# Patient Record
Sex: Female | Born: 1970
Health system: Southern US, Community
[De-identification: ages and names within clinical notes are randomized; demographics above are authoritative.]

## PROBLEM LIST (undated history)

## (undated) DIAGNOSIS — Z803 Family history of malignant neoplasm of breast: Secondary | ICD-10-CM

## (undated) DIAGNOSIS — T7840XA Allergy, unspecified, initial encounter: Secondary | ICD-10-CM

## (undated) DIAGNOSIS — F419 Anxiety disorder, unspecified: Secondary | ICD-10-CM

## (undated) DIAGNOSIS — E7800 Pure hypercholesterolemia, unspecified: Secondary | ICD-10-CM

## (undated) DIAGNOSIS — F32A Depression, unspecified: Secondary | ICD-10-CM

## (undated) HISTORY — PX: AUGMENTATION MAMMAPLASTY: SUR837

## (undated) HISTORY — DX: Anxiety disorder, unspecified: F41.9

## (undated) HISTORY — DX: Depression, unspecified: F32.A

## (undated) HISTORY — PX: JOINT REPLACEMENT: SHX530

## (undated) HISTORY — DX: Allergy, unspecified, initial encounter: T78.40XA

## (undated) HISTORY — PX: EYE SURGERY: SHX253

## (undated) HISTORY — DX: Family history of malignant neoplasm of breast: Z80.3

## (undated) HISTORY — PX: SHOULDER ARTHROSCOPY: SHX128

---

## 2013-05-17 ENCOUNTER — Ambulatory Visit: Payer: Self-pay | Admitting: Family Medicine

## 2013-11-05 DIAGNOSIS — G4700 Insomnia, unspecified: Secondary | ICD-10-CM | POA: Insufficient documentation

## 2014-06-19 ENCOUNTER — Ambulatory Visit: Payer: Self-pay | Admitting: Family Medicine

## 2015-02-12 ENCOUNTER — Other Ambulatory Visit: Payer: Self-pay | Admitting: Family Medicine

## 2015-03-19 ENCOUNTER — Emergency Department
Admission: EM | Admit: 2015-03-19 | Discharge: 2015-03-19 | Disposition: A | Discharge status: Home / Self Care | Payer: Worker's Compensation | Attending: Emergency Medicine | Admitting: Emergency Medicine

## 2015-03-19 ENCOUNTER — Encounter: Payer: Self-pay | Admitting: Medical Oncology

## 2015-03-19 DIAGNOSIS — W010XXA Fall on same level from slipping, tripping and stumbling without subsequent striking against object, initial encounter: Secondary | ICD-10-CM | POA: Diagnosis not present

## 2015-03-19 DIAGNOSIS — Y9389 Activity, other specified: Secondary | ICD-10-CM | POA: Insufficient documentation

## 2015-03-19 DIAGNOSIS — Y9289 Other specified places as the place of occurrence of the external cause: Secondary | ICD-10-CM | POA: Diagnosis not present

## 2015-03-19 DIAGNOSIS — Z880 Allergy status to penicillin: Secondary | ICD-10-CM | POA: Insufficient documentation

## 2015-03-19 DIAGNOSIS — Y990 Civilian activity done for income or pay: Secondary | ICD-10-CM | POA: Diagnosis not present

## 2015-03-19 DIAGNOSIS — S3992XA Unspecified injury of lower back, initial encounter: Secondary | ICD-10-CM | POA: Insufficient documentation

## 2015-03-19 DIAGNOSIS — M533 Sacrococcygeal disorders, not elsewhere classified: Secondary | ICD-10-CM

## 2015-03-19 HISTORY — DX: Pure hypercholesterolemia, unspecified: E78.00

## 2015-03-19 MED ORDER — CYCLOBENZAPRINE HCL 10 MG PO TABS: 10.0000 mg | ORAL_TABLET | Freq: Three times a day (TID) | ORAL | Status: DC | PRN

## 2015-03-19 MED ORDER — IBUPROFEN 800 MG PO TABS: 800.0000 mg | ORAL_TABLET | Freq: Three times a day (TID) | ORAL | Status: DC | PRN

## 2015-03-19 MED ORDER — NAPROXEN 500 MG PO TABS: 500.0000 mg | ORAL_TABLET | Freq: Two times a day (BID) | ORAL | Status: DC

## 2015-03-19 NOTE — ED Notes (Signed)
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

## 2015-03-19 NOTE — ED Notes (Signed)
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 Pt denies hitting head/LOC. Workers Tax adviser.

## 2015-03-19 NOTE — ED Provider Notes (Signed)
[facility] Emergency Department Provider Note  ____________________________________________  Time seen: Approximately 1:21 PM  I have reviewed the triage vital signs and the nursing notes.   HISTORY  Chief Complaint Fall and Back Pain    HPI Jenna Holt is a 44 y.o. female complaining of left back pain secondary to a slip and fall at work today. 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 Patient denies any head injury or loss of consciousness. Patient applied ice to area with some moderate relief.  Past Medical History  Diagnosis Date  . High cholesterol     There are no active problems to display for this patient.   Past Surgical History  Procedure Laterality Date  . Joint replacement      No current outpatient prescriptions on file.  Allergies Penicillins  No family history on file.  Social History Social History  Substance Use Topics  . Smoking status: Never Smoker   . Smokeless tobacco: None  . Alcohol Use: Yes     Comment: occ    Review of Systems Constitutional: No fever/chills Eyes: No visual changes. ENT: No sore throat. Cardiovascular: Denies chest pain. Respiratory: Denies shortness of breath. Gastrointestinal: No abdominal pain.  No nausea, no vomiting.  No diarrhea.  No constipation. Genitourinary: Negative for dysuria. Musculoskeletal: Positive for back pain Skin: Negative for rash. Neurological: Negative for headaches, focal weakness or numbness. Endocrine:Hyperlipidemia Hematological/Lymphatic: Allergic/Immunilogical: Insulin 10-point ROS otherwise negative.  ____________________________________________   PHYSICAL EXAM:  VITAL SIGNS: ED Triage Vitals  Enc Vitals Group     BP 03/19/15 1239 132/88 mmHg     Pulse Rate 03/19/15 1239 78     Resp 03/19/15 1239 18     Temp 03/19/15 1239 98.2 F (36.8 C)     Temp Source 03/19/15 1239 Oral     SpO2  03/19/15 1239 97 %     Weight 03/19/15 1239 160 lb (72.576 kg)     Height 03/19/15 1239 5\' 9"  (1.753 m)     Head Cir --      Peak Flow --      Pain Score 03/19/15 1240 6     Pain Loc --      Pain Edu? --      Excl. in [place]? --     Constitutional: Alert and oriented. Well appearing and in no acute distress. Eyes: Conjunctivae are normal. PERRL. EOMI. Head: Atraumatic. Nose: No congestion/rhinnorhea. Mouth/Throat: Mucous membranes are moist.  Oropharynx non-erythematous. Neck: No stridor.  No cervical spine tenderness to palpation. Hematological/Lymphatic/Immunilogical: No cervical lymphadenopathy. Cardiovascular: Normal rate, regular rhythm. Grossly normal heart sounds.  Good peripheral circulation. Respiratory: Normal respiratory effort.  No retractions. Lungs CTAB. Gastrointestinal: Soft and nontender. No distention. No abdominal bruits. No CVA tenderness. Musculoskeletal: No lower extremity tenderness nor edema.  No joint effusions. Neurologic:  Normal speech and language. No gross focal neurologic deficits are appreciated. No gait instability. Skin:  Skin is warm, dry and intact. No rash noted. Psychiatric: Mood and affect are normal. Speech and behavior are normal.  ____________________________________________   LABS (all labs ordered are listed, but only abnormal results are displayed)  Labs Reviewed - No data to display ____________________________________________  EKG   ____________________________________________  RADIOLOGY   ____________________________________________   PROCEDURES  Procedure(s) performed: None  Critical Care performed: No  ____________________________________________   INITIAL IMPRESSION / ASSESSMENT AND PLAN / ED COURSE  Pertinent labs & imaging results  that were available during my care of the patient were reviewed by me and considered in my medical decision making (see chart for details).  Left low back contusion. Patient advised  on supportive care for back pain. She prescription for naproxen and Flexeril take as directed. Patient advised to ER if condition worsens. ____________________________________________   FINAL CLINICAL IMPRESSION(S) / ED DIAGNOSES  Final diagnoses:  Back pain, sacroiliac      Sable Feil, PA-C 03/19/15 1406  Lisa Roca, MD 03/19/15 [phone]

## 2015-05-12 ENCOUNTER — Other Ambulatory Visit: Payer: Self-pay | Admitting: Family Medicine

## 2015-05-12 DIAGNOSIS — Z1231 Encounter for screening mammogram for malignant neoplasm of breast: Secondary | ICD-10-CM

## 2015-06-24 ENCOUNTER — Ambulatory Visit
Admission: RE | Admit: 2015-06-24 | Discharge: 2015-06-24 | Disposition: A | Discharge status: Home / Self Care | Payer: No Typology Code available for payment source | Source: Ambulatory Visit | Attending: Family Medicine | Admitting: Family Medicine

## 2015-06-24 ENCOUNTER — Ambulatory Visit: Payer: Self-pay

## 2015-06-24 ENCOUNTER — Other Ambulatory Visit: Payer: Self-pay | Admitting: Family Medicine

## 2015-06-24 DIAGNOSIS — Z1231 Encounter for screening mammogram for malignant neoplasm of breast: Secondary | ICD-10-CM | POA: Insufficient documentation

## 2015-06-24 DIAGNOSIS — Z9882 Breast implant status: Secondary | ICD-10-CM | POA: Diagnosis not present

## 2016-05-27 ENCOUNTER — Other Ambulatory Visit: Payer: Self-pay | Admitting: Family Medicine

## 2016-06-08 ENCOUNTER — Other Ambulatory Visit: Payer: Self-pay | Admitting: Family Medicine

## 2016-06-08 DIAGNOSIS — Z1231 Encounter for screening mammogram for malignant neoplasm of breast: Secondary | ICD-10-CM

## 2016-07-13 ENCOUNTER — Ambulatory Visit
Admission: RE | Admit: 2016-07-13 | Discharge: 2016-07-13 | Disposition: A | Discharge status: Home / Self Care | Payer: No Typology Code available for payment source | Source: Ambulatory Visit | Attending: Family Medicine | Admitting: Family Medicine

## 2016-07-13 DIAGNOSIS — Z1231 Encounter for screening mammogram for malignant neoplasm of breast: Secondary | ICD-10-CM | POA: Diagnosis not present

## 2017-06-29 ENCOUNTER — Other Ambulatory Visit: Payer: Self-pay | Admitting: Family Medicine

## 2017-06-29 DIAGNOSIS — Z1231 Encounter for screening mammogram for malignant neoplasm of breast: Secondary | ICD-10-CM

## 2017-08-04 ENCOUNTER — Ambulatory Visit
Admission: RE | Admit: 2017-08-04 | Discharge: 2017-08-04 | Disposition: A | Discharge status: Home / Self Care | Payer: Commercial Managed Care - PPO | Source: Ambulatory Visit | Attending: Family Medicine | Admitting: Family Medicine

## 2017-08-04 DIAGNOSIS — Z1231 Encounter for screening mammogram for malignant neoplasm of breast: Secondary | ICD-10-CM | POA: Diagnosis not present

## 2017-08-23 DIAGNOSIS — G8929 Other chronic pain: Secondary | ICD-10-CM | POA: Insufficient documentation

## 2017-08-23 DIAGNOSIS — M25552 Pain in left hip: Secondary | ICD-10-CM | POA: Insufficient documentation

## 2018-07-04 ENCOUNTER — Other Ambulatory Visit: Payer: Self-pay | Admitting: Family Medicine

## 2018-07-04 DIAGNOSIS — Z1231 Encounter for screening mammogram for malignant neoplasm of breast: Secondary | ICD-10-CM

## 2018-08-07 ENCOUNTER — Ambulatory Visit
Admission: RE | Admit: 2018-08-07 | Discharge: 2018-08-07 | Disposition: A | Discharge status: Home / Self Care | Payer: BLUE CROSS/BLUE SHIELD | Source: Ambulatory Visit | Attending: Family Medicine | Admitting: Family Medicine

## 2018-08-07 ENCOUNTER — Encounter: Payer: Self-pay | Admitting: Radiology

## 2018-08-07 DIAGNOSIS — Z1231 Encounter for screening mammogram for malignant neoplasm of breast: Secondary | ICD-10-CM | POA: Diagnosis present

## 2019-01-03 DIAGNOSIS — K5901 Slow transit constipation: Secondary | ICD-10-CM | POA: Insufficient documentation

## 2019-01-03 DIAGNOSIS — Z1211 Encounter for screening for malignant neoplasm of colon: Secondary | ICD-10-CM | POA: Insufficient documentation

## 2019-04-12 ENCOUNTER — Encounter: Payer: Self-pay | Admitting: Internal Medicine

## 2019-04-12 ENCOUNTER — Ambulatory Visit: Payer: 59 | Admitting: Internal Medicine

## 2019-04-12 ENCOUNTER — Other Ambulatory Visit: Payer: Self-pay

## 2019-04-12 VITALS — BP 138/93 | HR 78 | Temp 99.5°F | Resp 14 | Ht 69.0 in | Wt 177.0 lb

## 2019-04-12 DIAGNOSIS — R030 Elevated blood-pressure reading, without diagnosis of hypertension: Secondary | ICD-10-CM | POA: Insufficient documentation

## 2019-04-12 DIAGNOSIS — M25552 Pain in left hip: Secondary | ICD-10-CM

## 2019-04-12 NOTE — Progress Notes (Signed)
S - presents with left hip pain  Present for over 2 years, and has tried many measures including PT, dry needling, other modalities, and has increased more recent past Very much limiting her activities at present Painful at rest and increased with movements Occas feels it into her buttocks, and to the lateral side of her leg, but feels it mostly in the hip joint proper regio No fevers other infectious symptoms concerning for Covid Has tried NSAIDs, 800 mg ibuprofen and not getting relief Notes the pain makes her irritable and on edge No tobacco history Not able to run anymore, struggles to be active  No cancer history No diabetes history No HTN hx   Current Outpatient Medications on File Prior to Visit  Medication Sig Dispense Refill  . cyclobenzaprine (FLEXERIL) 10 MG tablet Take 1 tablet (10 mg total) by mouth every 8 (eight) hours as needed for muscle spasms. 15 tablet 0  . ibuprofen (ADVIL,MOTRIN) 800 MG tablet Take 1 tablet (800 mg total) by mouth every 8 (eight) hours as needed for moderate pain. 15 tablet 0  . rosuvastatin (CRESTOR) 20 MG tablet     . traMADol (ULTRAM) 50 MG tablet      No current facility-administered medications on file prior to visit.      Allergies  Allergen Reactions  . Latex Dermatitis    If worn  . Penicillins Hives  . Naproxen Hives, Itching and Rash     Never smoker  O - NAD, masked  BP (!) 138/93 (BP Location: Right Arm, Patient Position: Sitting, Cuff Size: Large)   Pulse 78   Temp 99.5 F (37.5 C) (Oral)   Resp 14   Ht 5\' 9"  (1.753 m)   Wt 177 lb (80.3 kg)   SpO2 96%   BMI 26.14 kg/m    HEENT - sclera anicteric Abdomen - NT with palpation diffusely, ND Hip - ROM:   + pain with hip flexion when supine (knee to chest)   + pain with hip abduction and adduction greater   Min pain with internal/external rotation  Tender with palpation at the hip joint proper  NT at trochanteric bursa region with palpation today  Modified FABER test  +  SLR neg and mild pain with SLR  NT CVA region and lower back  Sensation intact to LT in distal LE Affect not flat, approp with conversation  Ass - Left hip pain - acute on chronic, has tried the conservative approach in past and does believe she needs to see ortho again (as has in the past and one point did discuss total hip but not pursue at that time, did have injection in the past)  Plan -  Agreed with above and ortho referral initiated X-ray discussed - will get at ortho visit  Relative rest from exertional activities as doing at present  ice recommended liberally on a daily basis   Can use ibuprofen products or aleve product (or generic naproxen) up to two tabs twice daily prn with food prn She asked about natural remedies that may improve the pain and not a lot of evidence to support use of one, noted chondroitin products have some evidence for arthritis pain Noted anti-anxiety meds/anti-depressants that sometimes are added to help and she was not anxious to try these presently. She noted if things are more problematic in the short term, she will f/u again to entertain meds for this Await ortho input  2. BP increased today - likely up some with the  pain increase and will monitor at present. She denies any history of higher BP in her past.

## 2019-04-19 DIAGNOSIS — M1612 Unilateral primary osteoarthritis, left hip: Secondary | ICD-10-CM | POA: Diagnosis not present

## 2019-05-08 ENCOUNTER — Other Ambulatory Visit: Payer: 59

## 2019-05-08 ENCOUNTER — Ambulatory Visit: Payer: 59

## 2019-05-08 ENCOUNTER — Other Ambulatory Visit: Payer: Self-pay

## 2019-05-08 DIAGNOSIS — Z01818 Encounter for other preprocedural examination: Secondary | ICD-10-CM

## 2019-05-08 DIAGNOSIS — Z23 Encounter for immunization: Secondary | ICD-10-CM

## 2019-05-08 LAB — POCT URINALYSIS DIPSTICK
Bilirubin, UA: NEGATIVE
Blood, UA: NEGATIVE
Glucose, UA: NEGATIVE
Ketones, UA: NEGATIVE
Leukocytes, UA: NEGATIVE
Nitrite, UA: NEGATIVE
Protein, UA: NEGATIVE
Spec Grav, UA: 1.02 (ref 1.010–1.025)
Urobilinogen, UA: 0.2 E.U./dL
pH, UA: 6 (ref 5.0–8.0)

## 2019-05-08 NOTE — Progress Notes (Signed)
Labs & EKG collected for [facility] Pre-operative Clearance Form. Surgery tentatively scheduled for 06/22/2019.  AMD

## 2019-05-09 LAB — CMP12+LP+TP+TSH+6AC+CBC/D/PLT
ALT: 17 IU/L (ref 0–32)
AST: 32 IU/L (ref 0–40)
Albumin/Globulin Ratio: 1.9 (ref 1.2–2.2)
Albumin: 4.7 g/dL (ref 3.8–4.8)
Alkaline Phosphatase: 77 IU/L (ref 39–117)
BUN/Creatinine Ratio: 17 (ref 9–23)
BUN: 15 mg/dL (ref 6–24)
Basophils Absolute: 0 10*3/uL (ref 0.0–0.2)
Basos: 1 %
Bilirubin Total: 0.5 mg/dL (ref 0.0–1.2)
Calcium: 9.6 mg/dL (ref 8.7–10.2)
Chloride: 102 mmol/L (ref 96–106)
Chol/HDL Ratio: 3.5 ratio (ref 0.0–4.4)
Cholesterol, Total: 221 mg/dL — ABNORMAL HIGH (ref 100–199)
Creatinine, Ser: 0.87 mg/dL (ref 0.57–1.00)
EOS (ABSOLUTE): 0.1 10*3/uL (ref 0.0–0.4)
Eos: 1 %
Estimated CHD Risk: 0.6 times avg. (ref 0.0–1.0)
Free Thyroxine Index: 2.4 (ref 1.2–4.9)
GFR calc Af Amer: 90 mL/min/{1.73_m2} (ref >59)
GFR calc non Af Amer: 78 mL/min/{1.73_m2} (ref >59)
GGT: 10 IU/L (ref 0–60)
Globulin, Total: 2.5 g/dL (ref 1.5–4.5)
Glucose: 92 mg/dL (ref 65–99)
HDL: 63 mg/dL (ref >39)
Hematocrit: 40.9 % (ref 34.0–46.6)
Hemoglobin: 13.6 g/dL (ref 11.1–15.9)
Immature Grans (Abs): 0 10*3/uL (ref 0.0–0.1)
Immature Granulocytes: 0 %
Iron: 136 ug/dL (ref 27–159)
LDH: 236 IU/L — ABNORMAL HIGH (ref 119–226)
LDL Chol Calc (NIH): 144 mg/dL — ABNORMAL HIGH (ref 0–99)
Lymphocytes Absolute: 2.1 10*3/uL (ref 0.7–3.1)
Lymphs: 35 %
MCH: 29.8 pg (ref 26.6–33.0)
MCHC: 33.3 g/dL (ref 31.5–35.7)
MCV: 90 fL (ref 79–97)
Monocytes Absolute: 0.7 10*3/uL (ref 0.1–0.9)
Monocytes: 11 %
Neutrophils Absolute: 3.3 10*3/uL (ref 1.4–7.0)
Neutrophils: 52 %
Phosphorus: 3.1 mg/dL (ref 3.0–4.3)
Platelets: 240 10*3/uL (ref 150–450)
Potassium: 4.1 mmol/L (ref 3.5–5.2)
RBC: 4.56 x10E6/uL (ref 3.77–5.28)
RDW: 12.2 % (ref 11.7–15.4)
Sodium: 139 mmol/L (ref 134–144)
T3 Uptake Ratio: 30 % (ref 24–39)
T4, Total: 8 ug/dL (ref 4.5–12.0)
TSH: 1.3 u[IU]/mL (ref 0.450–4.500)
Total Protein: 7.2 g/dL (ref 6.0–8.5)
Triglycerides: 82 mg/dL (ref 0–149)
Uric Acid: 4.9 mg/dL (ref 2.5–7.1)
VLDL Cholesterol Cal: 14 mg/dL (ref 5–40)
WBC: 6.2 10*3/uL (ref 3.4–10.8)

## 2019-05-09 LAB — HGB A1C W/O EAG: Hgb A1c MFr Bld: 5.5 % (ref 4.8–5.6)

## 2019-05-15 ENCOUNTER — Ambulatory Visit: Payer: 59 | Admitting: Occupational Medicine

## 2019-05-15 ENCOUNTER — Encounter: Payer: Self-pay | Admitting: Occupational Medicine

## 2019-05-15 ENCOUNTER — Other Ambulatory Visit: Payer: Self-pay

## 2019-05-15 VITALS — BP 121/87 | HR 86 | Temp 98.6°F | Resp 12 | Ht 69.0 in | Wt 175.0 lb

## 2019-05-15 DIAGNOSIS — Z01818 Encounter for other preprocedural examination: Secondary | ICD-10-CM

## 2019-05-15 DIAGNOSIS — E785 Hyperlipidemia, unspecified: Secondary | ICD-10-CM | POA: Insufficient documentation

## 2019-05-15 DIAGNOSIS — F988 Other specified behavioral and emotional disorders with onset usually occurring in childhood and adolescence: Secondary | ICD-10-CM | POA: Insufficient documentation

## 2019-05-15 DIAGNOSIS — E7801 Familial hypercholesterolemia: Secondary | ICD-10-CM | POA: Insufficient documentation

## 2019-05-15 DIAGNOSIS — T7840XA Allergy, unspecified, initial encounter: Secondary | ICD-10-CM | POA: Insufficient documentation

## 2019-05-15 NOTE — Progress Notes (Signed)
Received medical clearance form from [facility] for LT THA anterior Hip surgery scheduled for 06/22/2019 by  Dr. Kurtis Bushman.  AMD

## 2019-06-10 HISTORY — PX: HIP SURGERY: SHX245

## 2019-06-19 ENCOUNTER — Other Ambulatory Visit: Payer: Self-pay | Admitting: Physician Assistant

## 2019-06-19 DIAGNOSIS — M25552 Pain in left hip: Secondary | ICD-10-CM | POA: Diagnosis not present

## 2019-06-19 DIAGNOSIS — Z01818 Encounter for other preprocedural examination: Secondary | ICD-10-CM | POA: Diagnosis not present

## 2019-06-19 DIAGNOSIS — M25652 Stiffness of left hip, not elsewhere classified: Secondary | ICD-10-CM | POA: Diagnosis not present

## 2019-06-20 ENCOUNTER — Encounter: Payer: Self-pay | Admitting: Registered Nurse

## 2019-06-20 ENCOUNTER — Telehealth: Payer: Self-pay | Admitting: Registered Nurse

## 2019-06-20 NOTE — Telephone Encounter (Signed)
PA Tamala Julian requested patient have follow up appt for flexeril refill.  Appt scheduled for Feb 2021.  Noted in Epic patient has surgery for hip scheduled 06/23/2019 with [facility].  Contacted patient via telephone to verify if she has run out of medication or if she has enough to get to surgery.  Patient reported she has tramadol for hip pain and that has decreased her use of flexeril.  She thinks she will not need refill until Feb 2021 appt for flexeril at this time.  Discussed with patient to keep her follow up appt Feb 2021 with [facility] provider and to notify clinic if the situation changes.  Patient verbalized understanding information/instructions,agreed with plan of care and had no further questions at this time.

## 2019-06-22 DIAGNOSIS — Z7982 Long term (current) use of aspirin: Secondary | ICD-10-CM | POA: Diagnosis not present

## 2019-06-22 DIAGNOSIS — Z880 Allergy status to penicillin: Secondary | ICD-10-CM | POA: Diagnosis not present

## 2019-06-22 DIAGNOSIS — E785 Hyperlipidemia, unspecified: Secondary | ICD-10-CM | POA: Diagnosis not present

## 2019-06-22 DIAGNOSIS — R69 Illness, unspecified: Secondary | ICD-10-CM | POA: Diagnosis not present

## 2019-06-22 DIAGNOSIS — M1712 Unilateral primary osteoarthritis, left knee: Secondary | ICD-10-CM | POA: Diagnosis not present

## 2019-06-22 DIAGNOSIS — Z9689 Presence of other specified functional implants: Secondary | ICD-10-CM | POA: Diagnosis not present

## 2019-06-22 DIAGNOSIS — M1612 Unilateral primary osteoarthritis, left hip: Secondary | ICD-10-CM | POA: Diagnosis not present

## 2019-06-22 DIAGNOSIS — E7800 Pure hypercholesterolemia, unspecified: Secondary | ICD-10-CM | POA: Diagnosis not present

## 2019-06-23 DIAGNOSIS — M1712 Unilateral primary osteoarthritis, left knee: Secondary | ICD-10-CM | POA: Diagnosis not present

## 2019-06-23 DIAGNOSIS — R69 Illness, unspecified: Secondary | ICD-10-CM | POA: Diagnosis not present

## 2019-06-23 DIAGNOSIS — Z9689 Presence of other specified functional implants: Secondary | ICD-10-CM | POA: Diagnosis not present

## 2019-06-23 DIAGNOSIS — M1612 Unilateral primary osteoarthritis, left hip: Secondary | ICD-10-CM | POA: Diagnosis not present

## 2019-06-23 DIAGNOSIS — E7800 Pure hypercholesterolemia, unspecified: Secondary | ICD-10-CM | POA: Diagnosis not present

## 2019-06-23 DIAGNOSIS — E785 Hyperlipidemia, unspecified: Secondary | ICD-10-CM | POA: Diagnosis not present

## 2019-06-23 DIAGNOSIS — Z880 Allergy status to penicillin: Secondary | ICD-10-CM | POA: Diagnosis not present

## 2019-06-23 DIAGNOSIS — Z7982 Long term (current) use of aspirin: Secondary | ICD-10-CM | POA: Diagnosis not present

## 2019-07-19 DIAGNOSIS — Z96642 Presence of left artificial hip joint: Secondary | ICD-10-CM | POA: Diagnosis not present

## 2019-07-19 DIAGNOSIS — M25552 Pain in left hip: Secondary | ICD-10-CM | POA: Diagnosis not present

## 2019-07-19 DIAGNOSIS — M25652 Stiffness of left hip, not elsewhere classified: Secondary | ICD-10-CM | POA: Diagnosis not present

## 2019-07-30 DIAGNOSIS — M25552 Pain in left hip: Secondary | ICD-10-CM | POA: Diagnosis not present

## 2019-07-30 DIAGNOSIS — M25652 Stiffness of left hip, not elsewhere classified: Secondary | ICD-10-CM | POA: Diagnosis not present

## 2019-07-30 DIAGNOSIS — Z96642 Presence of left artificial hip joint: Secondary | ICD-10-CM | POA: Diagnosis not present

## 2019-08-15 DIAGNOSIS — Z20822 Contact with and (suspected) exposure to covid-19: Secondary | ICD-10-CM | POA: Diagnosis not present

## 2019-08-15 DIAGNOSIS — B9689 Other specified bacterial agents as the cause of diseases classified elsewhere: Secondary | ICD-10-CM | POA: Diagnosis not present

## 2019-08-15 DIAGNOSIS — J019 Acute sinusitis, unspecified: Secondary | ICD-10-CM | POA: Diagnosis not present

## 2019-08-22 ENCOUNTER — Other Ambulatory Visit: Payer: Self-pay | Admitting: Internal Medicine

## 2019-08-22 ENCOUNTER — Encounter: Payer: Self-pay | Admitting: Internal Medicine

## 2019-08-22 NOTE — Telephone Encounter (Signed)
Last labs 05/08/2019 lipids and lfts stable.  Electronic Rx to her pharmacy of choice crestor generic 20mg  po daily #90 RF1.  Last office visit 05/15/2019.  Patient due follow up and labs Sep 2021.

## 2019-09-07 ENCOUNTER — Ambulatory Visit (INDEPENDENT_AMBULATORY_CARE_PROVIDER_SITE_OTHER): Payer: Managed Care, Other (non HMO) | Admitting: Obstetrics and Gynecology

## 2019-09-07 ENCOUNTER — Other Ambulatory Visit: Payer: Self-pay

## 2019-09-07 ENCOUNTER — Encounter: Payer: Self-pay | Admitting: Obstetrics and Gynecology

## 2019-09-07 ENCOUNTER — Other Ambulatory Visit (HOSPITAL_COMMUNITY)
Admission: RE | Admit: 2019-09-07 | Discharge: 2019-09-07 | Disposition: A | Discharge status: Home / Self Care | Payer: Commercial Managed Care - PPO | Source: Ambulatory Visit | Attending: Obstetrics and Gynecology | Admitting: Obstetrics and Gynecology

## 2019-09-07 VITALS — BP 106/78 | HR 87 | Ht 69.0 in | Wt 170.0 lb

## 2019-09-07 DIAGNOSIS — Z1239 Encounter for other screening for malignant neoplasm of breast: Secondary | ICD-10-CM | POA: Diagnosis not present

## 2019-09-07 DIAGNOSIS — Z124 Encounter for screening for malignant neoplasm of cervix: Secondary | ICD-10-CM

## 2019-09-07 DIAGNOSIS — Z01419 Encounter for gynecological examination (general) (routine) without abnormal findings: Secondary | ICD-10-CM | POA: Diagnosis not present

## 2019-09-07 NOTE — Progress Notes (Signed)
Gynecology Annual Exam  PCP: Patient, No Pcp Per  Chief Complaint:  Chief Complaint  Patient presents with  . Gynecologic Exam    Due for Pap been 4 years    History of Present Illness:Patient is a 49 y.o. G1P1 presents for annual exam. The patient has no complaints today.   LMP: Patient's last menstrual period was 07/23/2019 (exact date). Still having periods but more irregular.  The patient does perform self breast exams.  There is no notable family history of breast or ovarian cancer in her family.  The patient wears seatbelts: yes.   The patient has regular exercise: not asked.    The patient denies current symptoms of depression.     Review of Systems: Review of Systems  Constitutional: Negative for chills and fever.  HENT: Negative for congestion.   Respiratory: Negative for cough and shortness of breath.   Cardiovascular: Negative for chest pain and palpitations.  Gastrointestinal: Negative for abdominal pain, constipation, diarrhea, heartburn, nausea and vomiting.  Genitourinary: Negative for dysuria, frequency and urgency.  Skin: Negative for itching and rash.  Neurological: Negative for dizziness and headaches.  Endo/Heme/Allergies: Negative for polydipsia.  Psychiatric/Behavioral: Negative for depression.    Past Medical History:  Past Medical History:  Diagnosis Date  . High cholesterol     Past Surgical History:  Past Surgical History:  Procedure Laterality Date  . AUGMENTATION MAMMAPLASTY Bilateral    2013  . HIP SURGERY    . SHOULDER ARTHROSCOPY      Gynecologic History:  Patient's last menstrual period was 07/23/2019 (exact date).   Obstetric History: G1P1  Family History:  Family History  Problem Relation Age of Onset  . Breast cancer Maternal Grandmother        great grandmother  . Breast cancer Paternal Uncle        90's  . Heart attack Mother   . Lung cancer Father        smoker    Social History:  Social History    Socioeconomic History  . Marital status: Married    Spouse name: Not on file  . Number of children: Not on file  . Years of education: Not on file  . Highest education level: Not on file  Occupational History  . Not on file  Tobacco Use  . Smoking status: Never Smoker  . Smokeless tobacco: Never Used  Substance and Sexual Activity  . Alcohol use: Yes    Comment: occ  . Drug use: No  . Sexual activity: Yes    Birth control/protection: None  Other Topics Concern  . Not on file  Social History Narrative  . Not on file   Social Determinants of Health   Financial Resource Strain:   . Difficulty of Paying Living Expenses: Not on file  Food Insecurity:   . Worried About Charity fundraiser in the Last Year: Not on file  . Ran Out of Food in the Last Year: Not on file  Transportation Needs:   . Lack of Transportation (Medical): Not on file  . Lack of Transportation (Non-Medical): Not on file  Physical Activity:   . Days of Exercise per Week: Not on file  . Minutes of Exercise per Session: Not on file  Stress:   . Feeling of Stress : Not on file  Social Connections:   . Frequency of Communication with Friends and Family: Not on file  . Frequency of Social Gatherings with Friends and Family: Not  on file  . Attends Religious Services: Not on file  . Active Member of Clubs or Organizations: Not on file  . Attends Archivist Meetings: Not on file  . Marital Status: Not on file  Intimate Partner Violence:   . Fear of Current or Ex-Partner: Not on file  . Emotionally Abused: Not on file  . Physically Abused: Not on file  . Sexually Abused: Not on file    Allergies:  Allergies  Allergen Reactions  . Latex Dermatitis    If worn  . Penicillins Hives  . Naproxen Hives, Itching and Rash    Medications: Prior to Admission medications   Medication Sig Start Date End Date Taking? Authorizing Provider  cyclobenzaprine (FLEXERIL) 10 MG tablet Take 1 tablet (10 mg  total) by mouth every 8 (eight) hours as needed for muscle spasms. 03/19/15  Yes Sable Feil, PA-C  rosuvastatin (CRESTOR) 20 MG tablet TAKE 1 TABLET BY MOUTH DAILY 08/22/19  Yes Betancourt, Aura Fey, NP  traMADol (ULTRAM) 50 MG tablet  10/26/18  Yes [provider]    Physical Exam Vitals: Blood pressure 106/78, pulse 87, height 5\' 9"  (1.753 m), weight 170 lb (77.1 kg), last menstrual period 07/23/2019.  General: NAD HEENT: normocephalic, anicteric Thyroid: no enlargement, no palpable nodules Pulmonary: No increased work of breathing, CTAB Cardiovascular: RRR, distal pulses 2+ Breast: Breast symmetrical, no tenderness, no palpable nodules or masses, no skin or nipple retraction present, no nipple discharge.  No axillary or supraclavicular lymphadenopathy. Abdomen: NABS, soft, non-tender, non-distended.  Umbilicus without lesions.  No hepatomegaly, splenomegaly or masses palpable. No evidence of hernia  Genitourinary:  External: Normal external female genitalia.  Normal urethral meatus, normal Bartholin's and Skene's glands.    Vagina: Normal vaginal mucosa, no evidence of prolapse.    Cervix: Grossly normal in appearance, no bleeding  Uterus: Non-enlarged, mobile, normal contour.  No CMT  Adnexa: ovaries non-enlarged, no adnexal masses  Rectal: deferred  Lymphatic: no evidence of inguinal lymphadenopathy Extremities: no edema, erythema, or tenderness Neurologic: Grossly intact Psychiatric: mood appropriate, affect full  Female chaperone present for pelvic and breast  portions of the physical exam     Assessment: 50 y.o. G1P1 routine annual exam  Plan: Problem List Items Addressed This Visit    None    Visit Diagnoses    Encounter for gynecological examination without abnormal finding    -  Primary   Screening for malignant neoplasm of cervix       Relevant Orders   Cytology - PAP   Breast screening       Relevant Orders   MM 3D SCREEN BREAST BILATERAL      1)  Mammogram - recommend yearly screening mammogram.  Mammogram Was ordered today  2) STI screening  was notoffered and therefore not obtained  3) ASCCP guidelines and rational discussed.  Patient opts for every 3 years screening interval  4) Osteoporosis  - per USPTF routine screening DEXA at age 20  5) Routine healthcare maintenance including cholesterol, diabetes screening discussed managed by PCP  6) Colonoscopy  - pending was scheduled prior to COVID restrictions  7) Return in about 1 year (around 09/06/2020) for annual.    Malachy Mood, MD [facility] P[facility]on, [facility] 09/07/2019, 9:28 AM

## 2019-09-07 NOTE — Patient Instructions (Signed)
[facility] [address] [place] [address]  [facility] [place]  [address] [place] [address]  Phone: [phone]  

## 2019-09-11 LAB — CYTOLOGY - PAP
Adequacy: ABSENT
Comment: NEGATIVE
Diagnosis: NEGATIVE
High risk HPV: NEGATIVE

## 2019-09-12 ENCOUNTER — Ambulatory Visit: Payer: 59

## 2019-09-12 ENCOUNTER — Other Ambulatory Visit: Payer: Self-pay

## 2019-09-12 DIAGNOSIS — Z0000 Encounter for general adult medical examination without abnormal findings: Secondary | ICD-10-CM

## 2019-09-12 LAB — POCT URINALYSIS DIPSTICK
Bilirubin, UA: NEGATIVE
Blood, UA: NEGATIVE
Glucose, UA: NEGATIVE
Ketones, UA: NEGATIVE
Leukocytes, UA: NEGATIVE
Nitrite, UA: NEGATIVE
Protein, UA: NEGATIVE
Spec Grav, UA: 1.015 (ref 1.010–1.025)
Urobilinogen, UA: 0.2 E.U./dL
pH, UA: 6 (ref 5.0–8.0)

## 2019-09-12 NOTE — Progress Notes (Signed)
Patient comes in today for pre physical labs and EKG. Patient is scheduled for a physical with Gerarda Fraction, PA-C on 09/19/2019.

## 2019-09-13 LAB — CMP12+LP+TP+TSH+6AC+CBC/D/PLT
ALT: 26 IU/L (ref 0–32)
AST: 46 IU/L — ABNORMAL HIGH (ref 0–40)
Albumin/Globulin Ratio: 2 (ref 1.2–2.2)
Albumin: 4.8 g/dL (ref 3.8–4.9)
Alkaline Phosphatase: 76 IU/L (ref 39–117)
BUN/Creatinine Ratio: 12 (ref 9–23)
BUN: 10 mg/dL (ref 6–24)
Basophils Absolute: 0 10*3/uL (ref 0.0–0.2)
Basos: 1 %
Bilirubin Total: 0.3 mg/dL (ref 0.0–1.2)
Calcium: 9.4 mg/dL (ref 8.7–10.2)
Chloride: 103 mmol/L (ref 96–106)
Chol/HDL Ratio: 3.5 ratio (ref 0.0–4.4)
Cholesterol, Total: 243 mg/dL — ABNORMAL HIGH (ref 100–199)
Creatinine, Ser: 0.81 mg/dL (ref 0.57–1.00)
EOS (ABSOLUTE): 0.1 10*3/uL (ref 0.0–0.4)
Eos: 3 %
Estimated CHD Risk: 0.6 times avg. (ref 0.0–1.0)
Free Thyroxine Index: 1.6 (ref 1.2–4.9)
GFR calc Af Amer: 97 mL/min/{1.73_m2} (ref >59)
GFR calc non Af Amer: 84 mL/min/{1.73_m2} (ref >59)
GGT: 13 IU/L (ref 0–60)
Globulin, Total: 2.4 g/dL (ref 1.5–4.5)
Glucose: 93 mg/dL (ref 65–99)
HDL: 70 mg/dL (ref >39)
Hematocrit: 40.7 % (ref 34.0–46.6)
Hemoglobin: 13.5 g/dL (ref 11.1–15.9)
Immature Grans (Abs): 0 10*3/uL (ref 0.0–0.1)
Immature Granulocytes: 0 %
Iron: 131 ug/dL (ref 27–159)
LDH: 290 IU/L — ABNORMAL HIGH (ref 119–226)
LDL Chol Calc (NIH): 160 mg/dL — ABNORMAL HIGH (ref 0–99)
Lymphocytes Absolute: 2 10*3/uL (ref 0.7–3.1)
Lymphs: 37 %
MCH: 29.7 pg (ref 26.6–33.0)
MCHC: 33.2 g/dL (ref 31.5–35.7)
MCV: 90 fL (ref 79–97)
Monocytes Absolute: 0.6 10*3/uL (ref 0.1–0.9)
Monocytes: 11 %
Neutrophils Absolute: 2.8 10*3/uL (ref 1.4–7.0)
Neutrophils: 48 %
Phosphorus: 3.5 mg/dL (ref 3.0–4.3)
Platelets: 278 10*3/uL (ref 150–450)
Potassium: 4.4 mmol/L (ref 3.5–5.2)
RBC: 4.55 x10E6/uL (ref 3.77–5.28)
RDW: 12.8 % (ref 11.7–15.4)
Sodium: 141 mmol/L (ref 134–144)
T3 Uptake Ratio: 27 % (ref 24–39)
T4, Total: 6.1 ug/dL (ref 4.5–12.0)
TSH: 1.35 u[IU]/mL (ref 0.450–4.500)
Total Protein: 7.2 g/dL (ref 6.0–8.5)
Triglycerides: 76 mg/dL (ref 0–149)
Uric Acid: 4.2 mg/dL (ref 3.0–7.2)
VLDL Cholesterol Cal: 13 mg/dL (ref 5–40)
WBC: 5.6 10*3/uL (ref 3.4–10.8)

## 2019-09-15 ENCOUNTER — Other Ambulatory Visit: Payer: Self-pay | Admitting: Registered Nurse

## 2019-09-18 DIAGNOSIS — H524 Presbyopia: Secondary | ICD-10-CM | POA: Diagnosis not present

## 2019-09-19 ENCOUNTER — Other Ambulatory Visit: Payer: Self-pay

## 2019-09-19 ENCOUNTER — Ambulatory Visit: Payer: 59 | Admitting: Registered Nurse

## 2019-09-19 VITALS — BP 122/86 | HR 68 | Temp 98.6°F | Ht 69.0 in | Wt 168.0 lb

## 2019-09-19 DIAGNOSIS — Z1231 Encounter for screening mammogram for malignant neoplasm of breast: Secondary | ICD-10-CM

## 2019-09-19 DIAGNOSIS — M25561 Pain in right knee: Secondary | ICD-10-CM

## 2019-09-19 DIAGNOSIS — Z0000 Encounter for general adult medical examination without abnormal findings: Secondary | ICD-10-CM

## 2019-09-19 DIAGNOSIS — Z1211 Encounter for screening for malignant neoplasm of colon: Secondary | ICD-10-CM

## 2019-09-19 DIAGNOSIS — F988 Other specified behavioral and emotional disorders with onset usually occurring in childhood and adolescence: Secondary | ICD-10-CM

## 2019-09-19 DIAGNOSIS — E785 Hyperlipidemia, unspecified: Secondary | ICD-10-CM

## 2019-09-19 DIAGNOSIS — J019 Acute sinusitis, unspecified: Secondary | ICD-10-CM

## 2019-09-19 MED ORDER — SALINE SPRAY 0.65 % NA SOLN: 2.0000 | NASAL | 0 refills | Status: DC

## 2019-09-19 MED ORDER — ACETAMINOPHEN 500 MG PO TABS: 1000.0000 mg | ORAL_TABLET | Freq: Four times a day (QID) | ORAL | 0 refills | Status: AC | PRN

## 2019-09-19 MED ORDER — IBUPROFEN 800 MG PO TABS: 800.0000 mg | ORAL_TABLET | Freq: Three times a day (TID) | ORAL | 0 refills | Status: DC

## 2019-09-19 NOTE — Patient Instructions (Signed)
How to Perform a Sinus Rinse A sinus rinse is a home treatment that is used to rinse your sinuses with a sterile mixture of salt and water (saline solution). Sinuses are air-filled spaces in your skull behind the bones of your face and forehead that open into your nasal cavity. A sinus rinse can help to clear mucus, dirt, dust, or pollen from your nasal cavity. You may do a sinus rinse when you have a cold, a virus, nasal allergy symptoms, a sinus infection, or stuffiness in your nose or sinuses. Talk with your health care provider about whether a sinus rinse might help you. What are the risks? A sinus rinse is generally safe and effective. However, there are a few risks, which include:  A burning sensation in your sinuses. This may happen if you do not make the saline solution as directed. Be sure to follow all directions when making the saline solution.  Nasal irritation.  Infection from contaminated water. This is rare, but possible. Do not do a sinus rinse if you have had ear or nasal surgery, ear infection, or blocked ears. Supplies needed:  Saline solution or powder.  Distilled or sterile water may be needed to mix with saline powder. ? You may use boiled and cooled tap water. Boil tap water for 5 minutes; cool until it is lukewarm. Use within 24 hours. ? Do not use regular tap water to mix with the saline solution.  Neti pot or nasal rinse bottle. These supplies release the saline solution into your nose and through your sinuses. Neti pots and nasal rinse bottles can be purchased at [employer], a health food store, or online. How to perform a sinus rinse  1. Wash your hands with soap and water. 2. Wash your device according to the directions that came with the product and then dry it. 3. Use the solution that comes with your product or one that is sold separately in stores. Follow the mixing directions on the package if you need to mix with sterile or distilled  water. 4. Fill the device with the amount of saline solution noted in the device instructions. 5. Stand over a sink and tilt your head sideways over the sink. 6. Place the spout of the device in your upper nostril (the one closer to the ceiling). 7. Gently pour or squeeze the saline solution into your nasal cavity. The liquid should drain out from the lower nostril if you are not too congested. 8. While rinsing, breathe through your open mouth. 9. Gently blow your nose to clear any mucus and rinse solution. Blowing too hard may cause ear pain. 10. Repeat in your other nostril. 11. Clean and rinse your device with clean water and then air-dry it. Talk with your health care provider or pharmacist if you have questions about how to do a sinus rinse. Summary  A sinus rinse is a home treatment that is used to rinse your sinuses with a sterile mixture of salt and water (saline solution).  A sinus rinse is generally safe and effective. Follow all instructions carefully.  Before doing a sinus rinse, talk with your health care provider about whether it would be helpful for you. This information is not intended to replace advice given to you by your health care provider. Make sure you discuss any questions you have with your health care provider. Document Revised: 05/23/2017 Document Reviewed: 05/23/2017 Elsevier Patient Education  [place]. Sinusitis, Adult Sinusitis is inflammation of your sinuses. Sinuses  are hollow spaces in the bones around your face. Your sinuses are located:  Around your eyes.  In the middle of your forehead.  Behind your nose.  In your cheekbones. Mucus normally drains out of your sinuses. When your nasal tissues become inflamed or swollen, mucus can become trapped or blocked. This allows bacteria, viruses, and fungi to grow, which leads to infection. Most infections of the sinuses are caused by a virus. Sinusitis can develop quickly. It can last for up to 4  weeks (acute) or for more than 12 weeks (chronic). Sinusitis often develops after a cold. What are the causes? This condition is caused by anything that creates swelling in the sinuses or stops mucus from draining. This includes:  Allergies.  Asthma.  Infection from bacteria or viruses.  Deformities or blockages in your nose or sinuses.  Abnormal growths in the nose (nasal polyps).  Pollutants, such as chemicals or irritants in the air.  Infection from fungi (rare). What increases the risk? You are more likely to develop this condition if you:  Have a weak body defense system (immune system).  Do a lot of swimming or diving.  Overuse nasal sprays.  Smoke. What are the signs or symptoms? The main symptoms of this condition are pain and a feeling of pressure around the affected sinuses. Other symptoms include:  Stuffy nose or congestion.  Thick drainage from your nose.  Swelling and warmth over the affected sinuses.  Headache.  Upper toothache.  A cough that may get worse at night.  Extra mucus that collects in the throat or the back of the nose (postnasal drip).  Decreased sense of smell and taste.  Fatigue.  A fever.  Sore throat.  Bad breath. How is this diagnosed? This condition is diagnosed based on:  Your symptoms.  Your medical history.  A physical exam.  Tests to find out if your condition is acute or chronic. This may include: ? Checking your nose for nasal polyps. ? Viewing your sinuses using a device that has a light (endoscope). ? Testing for allergies or bacteria. ? Imaging tests, such as an MRI or CT scan. In rare cases, a bone biopsy may be done to rule out more serious types of fungal sinus disease. How is this treated? Treatment for sinusitis depends on the cause and whether your condition is chronic or acute.  If caused by a virus, your symptoms should go away on their own within 10 days. You may be given medicines to relieve  symptoms. They include: ? Medicines that shrink swollen nasal passages (topical intranasal decongestants). ? Medicines that treat allergies (antihistamines). ? A spray that eases inflammation of the nostrils (topical intranasal corticosteroids). ? Rinses that help get rid of thick mucus in your nose (nasal saline washes).  If caused by bacteria, your health care provider may recommend waiting to see if your symptoms improve. Most bacterial infections will get better without antibiotic medicine. You may be given antibiotics if you have: ? A severe infection. ? A weak immune system.  If caused by narrow nasal passages or nasal polyps, you may need to have surgery. Follow these instructions at home: Medicines  Take, use, or apply over-the-counter and prescription medicines only as told by your health care provider. These may include nasal sprays.  If you were prescribed an antibiotic medicine, take it as told by your health care provider. Do not stop taking the antibiotic even if you start to feel better. Hydrate and humidify     Drink enough fluid to keep your urine pale yellow. Staying hydrated will help to thin your mucus.  Use a cool mist humidifier to keep the humidity level in your home above 50%.  Inhale steam for 10-15 minutes, 3-4 times a day, or as told by your health care provider. You can do this in the bathroom while a hot shower is running.  Limit your exposure to cool or dry air. Rest  Rest as much as possible.  Sleep with your head raised (elevated).  Make sure you get enough sleep each night. General instructions   Apply a warm, moist washcloth to your face 3-4 times a day or as told by your health care provider. This will help with discomfort.  Wash your hands often with soap and water to reduce your exposure to germs. If soap and water are not available, use hand sanitizer.  Do not smoke. Avoid being around people who are smoking (secondhand smoke).  Keep all  follow-up visits as told by your health care provider. This is important. Contact a health care provider if:  You have a fever.  Your symptoms get worse.  Your symptoms do not improve within 10 days. Get help right away if:  You have a severe headache.  You have persistent vomiting.  You have severe pain or swelling around your face or eyes.  You have vision problems.  You develop confusion.  Your neck is stiff.  You have trouble breathing. Summary  Sinusitis is soreness and inflammation of your sinuses. Sinuses are hollow spaces in the bones around your face.  This condition is caused by nasal tissues that become inflamed or swollen. The swelling traps or blocks the flow of mucus. This allows bacteria, viruses, and fungi to grow, which leads to infection.  If you were prescribed an antibiotic medicine, take it as told by your health care provider. Do not stop taking the antibiotic even if you start to feel better.  Keep all follow-up visits as told by your health care provider. This is important. This information is not intended to replace advice given to you by your health care provider. Make sure you discuss any questions you have with your health care provider. Document Revised: 12/26/2017 Document Reviewed: 12/26/2017 Elsevier Patient Education  [place]. Tendinitis  Tendinitis is inflammation of a tendon. A tendon is a strong cord of tissue that connects muscle to bone. Tendinitis can affect any tendon, but it most commonly affects the:  Shoulder tendon (rotator cuff).  Ankle tendon (Achilles tendon).  Elbow tendon (triceps tendon).  Tendons in the wrist. What are the causes? This condition may be caused by:  Overusing a tendon or muscle. This is common.  Age-related wear and tear.  Injury.  Inflammatory conditions, such as arthritis.  Certain medicines. What increases the risk? You are more likely to develop this condition if you do  activities that involve the same movements over and over again (repetitive motions). What are the signs or symptoms? Symptoms of this condition may include:  Pain.  Tenderness.  Mild swelling.  Decreased range of motion. How is this diagnosed? This condition is diagnosed with a physical exam. You may also have tests, such as:  Ultrasound. This uses sound waves to make an image of the inside of your body in the affected area.  MRI. How is this treated? This condition may be treated by resting, icing, applying pressure (compression), and raising (elevating) the affected area above the level of your heart. This  is known as RICE therapy. Treatment may also include:  Medicines to help reduce inflammation or to help reduce pain.  Exercises or physical therapy to strengthen and stretch the tendon.  A brace or splint.  Surgery. This is rarely needed. Follow these instructions at home: If you have a splint or brace:  Wear the splint or brace as told by your health care provider. Remove it only as told by your health care provider.  Loosen the splint or brace if your fingers or toes tingle, become numb, or turn cold and blue.  Keep the splint or brace clean.  If the splint or brace is not waterproof: ? Do not let it get wet. ? Cover it with a watertight covering when you take a bath or shower. Managing pain, stiffness, and swelling  If directed, put ice on the affected area. ? If you have a removable splint or brace, remove it as told by your health care provider. ? Put ice in a plastic bag. ? Place a towel between your skin and the bag. ? Leave the ice on for 20 minutes, 2-3 times a day.  Move the fingers or toes of the affected limb often, if this applies. This can help to prevent stiffness and lessen swelling.  If directed, raise (elevate) the affected area above the level of your heart while you are sitting or lying down.  If directed, apply heat to the affected area  before you exercise. Use the heat source that your health care provider recommends, such as a moist heat pack or a heating pad.     ? Place a towel between your skin and the heat source. ? Leave the heat on for 20-30 minutes. ? Remove the heat if your skin turns bright red. This is especially important if you are unable to feel pain, heat, or cold. You may have a greater risk of getting burned. Driving  Do not drive or use heavy machinery while taking prescription pain medicine.  Ask your health care provider when it is safe to drive if you have a splint or brace on any part of your arm or leg. Activity  Rest the affected area as told by your health care provider.  Return to your normal activities as told by your health care provider. Ask your health care provider what activities are safe for you.  Avoid using the affected area while you are experiencing symptoms of tendinitis.  Do exercises as told by your health care provider. General instructions  If you have a splint, do not put pressure on any part of the splint until it is fully hardened. This may take several hours.  Wear an elastic bandage or compression wrap only as told by your health care provider.  Take over-the-counter and prescription medicines only as told by your health care provider.  Keep all follow-up visits as told by your health care provider. This is important. Contact a health care provider if:  Your symptoms do not improve.  You develop new, unexplained problems, such as numbness in your hands. Summary  Tendinitis is inflammation of a tendon.  You are more likely to develop this condition if you do activities that involve the same movements over and over again.  This condition may be treated by resting, icing, applying pressure (compression), and elevating the area above the level of your heart. This is known as RICE therapy.  Avoid using the affected area while you are experiencing symptoms of  tendinitis. This information is  not intended to replace advice given to you by your health care provider. Make sure you discuss any questions you have with your health care provider. Document Revised: 01/31/2018 Document Reviewed: 12/14/2017 Elsevier Patient Education  [place] [facility]  A Baker cyst, also called a popliteal cyst, is a growth that forms at the back of the knee. The cyst forms when the fluid-filled sac (bursa) that cushions the knee joint becomes enlarged. What are the causes? In most cases, a Baker cyst results from another knee problem that causes swelling inside the knee. This makes the fluid inside the knee joint (synovial fluid) flow into the bursa behind the knee, causing the bursa to enlarge. What increases the risk? You may be more likely to develop a Baker cyst if you already have a knee problem, such as:  A tear in cartilage that cushions the knee joint (meniscal tear).  A tear in the tissues that connect the bones of the knee joint (ligament tear).  Knee swelling from osteoarthritis, rheumatoid arthritis, or gout. What are the signs or symptoms? The main symptom of this condition is a lump behind the knee. This may be the only symptom of the condition. The lump may be painful, especially when the knee is straightened. If the lump is painful, the pain may come and go. The knee may also be stiff. Symptoms may quickly get more severe if the cyst breaks open (ruptures). If the cyst ruptures, you may feel the following in your knee and calf:  Sudden or worsening pain.  Swelling.  Bruising.  Redness in the calf. A Baker cyst does not always cause symptoms. How is this diagnosed? This condition may be diagnosed based on your symptoms and medical history. Your health care provider will also do a physical exam. This may include:  Feeling the cyst to check whether it is tender.  Checking your knee for signs of another knee condition that causes  swelling. You may have imaging tests, such as:  X-rays.  MRI.  Ultrasound. How is this treated? A Baker cyst that is not painful may go away without treatment. If the cyst gets large or painful, it will likely get better if the underlying knee problem is treated. If needed, treatment for a Baker cyst may include:  Resting.  Keeping weight off of the knee. This means not leaning on the knee to support your body weight.  Taking NSAIDs, such as ibuprofen, to reduce pain and swelling.  Having a procedure to drain the fluid from the cyst with a needle (aspiration). You may also get an injection of a medicine that reduces swelling (steroid).  Having surgery. This may be needed if other treatments do not work. This usually involves correcting knee damage and removing the cyst. Follow these instructions at home:  Activity  Rest as told by your health care provider.  Avoid activities that make pain or swelling worse.  Return to your normal activities as told by your health care provider. Ask your health care provider what activities are safe for you.  Do not use the injured limb to support your body weight until your health care provider says that you can. Use crutches as told by your health care provider. General instructions  Take over-the-counter and prescription medicines only as told by your health care provider.  Keep all follow-up visits as told by your health care provider. This is important. Contact a health care provider if:  You have knee pain, stiffness, or swelling that  does not get better. Get help right away if:  You have sudden or worsening pain and swelling in your calf area. Summary  A Baker cyst, also called a popliteal cyst, is a growth that forms at the back of the knee.  In most cases, a Baker cyst results from another knee problem that causes swelling inside the knee.  A Baker cyst that is not painful may go away without treatment.  If needed,  treatment for a Baker cyst may include resting, keeping weight off of the knee, medicines, or draining fluid from the cyst.  Surgery may be needed if other treatments are not effective. This information is not intended to replace advice given to you by your health care provider. Make sure you discuss any questions you have with your health care provider. Document Revised: 12/08/2018 Document Reviewed: 12/08/2018 Elsevier Patient Education  [place].

## 2019-09-19 NOTE — Progress Notes (Signed)
Subjective:    Patient ID: Jenna Holt, female    DOB: [birth year], 49 y.o.   MRN: [id number]  49y/o caucasian female established patient s/p hip left surgery Nov 2020 healed no pain.  Colonoscopy still needs to be rescheduled.  Had annual women's appt and mammogram scheduled Mar 2021.  Right knee swelling, lateral and medial pain above kneecap and posterior fossa pain 5/10.  Tried tylenol and ice.  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  Just started exercise regimen 3-4 weeks ago.  Denied locking/giving out.  Taking her crestor every day.  Alcohol on weekends not every weekend. Colonoscopy was cancelled due to covid needs to reschedule unsure if provider still in network insurance changed.  Runny nose and mild sinus headache bothering her did telehealth visit with [facility] new nasal spray and doxycycline didn't help Jan 2021.  Upper teeth slightly sore.  Has used oral steroid taper in the past but does not want to use at this time.  Patient has only had rash with aleve not ibuprofen/advil/motrin.  Taking tylenol and motrin OTC without rash or signs/symptoms of allergic reaction.  Patient reported not needing tramadol every day any longer only prn and elavil working well for her.     Review of Systems  Constitutional: Positive for activity change. Negative for appetite change, chills, diaphoresis, fatigue and fever.  HENT: Positive for congestion, postnasal drip, rhinorrhea, sinus pressure and sinus pain. Negative for dental problem, drooling, ear discharge, ear pain, facial swelling, hearing loss, nosebleeds, sneezing, sore throat, tinnitus, trouble swallowing and voice change.   Eyes: Negative for photophobia, pain, discharge, redness, itching and visual disturbance.  Respiratory: Negative for cough, shortness of breath, wheezing and stridor.   Cardiovascular: Negative for chest pain and palpitations.  Gastrointestinal: Negative for abdominal pain, diarrhea, nausea and  vomiting.  Endocrine: Negative for cold intolerance and heat intolerance.  Genitourinary: Negative for difficulty urinating.  Musculoskeletal: Positive for joint swelling and myalgias. Negative for back pain, gait problem, neck pain and neck stiffness.  Skin: Negative for color change, pallor, rash and wound.  Allergic/Immunologic: Positive for environmental allergies. Negative for food allergies.  Neurological: Positive for headaches. Negative for dizziness, tremors, seizures, syncope, facial asymmetry, speech difficulty, weakness, light-headedness and numbness.  Hematological: Negative for adenopathy. Does not bruise/bleed easily.  Psychiatric/Behavioral: Negative for agitation, confusion and sleep disturbance.       Objective:   Physical Exam Vitals and nursing note reviewed.  Constitutional:      General: She is awake. She is not in acute distress.    Appearance: Normal appearance. She is well-developed, well-groomed and normal weight. She is not ill-appearing, toxic-appearing or diaphoretic.  HENT:     Head: Normocephalic and atraumatic.     Jaw: There is normal jaw occlusion. No trismus.     Salivary Glands: Right salivary gland is not diffusely enlarged or tender. Left salivary gland is not diffusely enlarged or tender.     Right Ear: Hearing, ear canal and external ear normal. A middle ear effusion is present. There is no impacted cerumen. Tympanic membrane is erythematous.     Left Ear: Hearing, ear canal and external ear normal. A middle ear effusion is present. There is no impacted cerumen. Tympanic membrane is erythematous.     Ears:      Comments: Centrally 2mm area erythema bilaterally TMs    Nose: No nasal deformity, septal deviation, laceration, nasal tenderness, mucosal edema, congestion or rhinorrhea.     Right  Nostril: No epistaxis.     Left Nostril: No epistaxis.     Right Sinus: Maxillary sinus tenderness present. No frontal sinus tenderness.     Left Sinus:  Maxillary sinus tenderness present. No frontal sinus tenderness.     Comments: Maxillary sinuses mildly TTP bilaterally; bilateral allergic shiners; air fluid level clear bilateral TMs    Mouth/Throat:     Lips: Pink. No lesions.     Mouth: Mucous membranes are moist. Mucous membranes are not pale, not dry and not cyanotic. No lacerations, oral lesions or angioedema.     Dentition: No dental caries, dental abscesses or gum lesions.     Tongue: No lesions. Tongue does not deviate from midline.     Palate: No mass and lesions.     Pharynx: Oropharynx is clear. Uvula midline. No pharyngeal swelling, oropharyngeal exudate, posterior oropharyngeal erythema or uvula swelling.     Tonsils: No tonsillar exudate or tonsillar abscesses.  Eyes:     General: Lids are normal. Vision grossly intact. Gaze aligned appropriately. Allergic shiner present. No visual field deficit or scleral icterus.       Right eye: No foreign body, discharge or hordeolum.        Left eye: No foreign body, discharge or hordeolum.     Extraocular Movements: Extraocular movements intact.     Right eye: Normal extraocular motion and no nystagmus.     Left eye: Normal extraocular motion and no nystagmus.     Conjunctiva/sclera: Conjunctivae normal.     Right eye: Right conjunctiva is not injected. No chemosis, exudate or hemorrhage.    Left eye: Left conjunctiva is not injected. No chemosis, exudate or hemorrhage.    Pupils: Pupils are equal, round, and reactive to light. Pupils are equal.     Right eye: Pupil is round and reactive.     Left eye: Pupil is round and reactive.  Neck:     Thyroid: No thyroid mass, thyromegaly or thyroid tenderness.     Trachea: Trachea and phonation normal. No tracheal tenderness or tracheal deviation.  Cardiovascular:     Rate and Rhythm: Normal rate and regular rhythm.     Chest Wall: PMI is not displaced.     Pulses: Normal pulses.          Radial pulses are 2+ on the right side and 2+ on the  left side.     Heart sounds: Normal heart sounds, S1 normal and S2 normal. No murmur. No friction rub. No gallop.   Pulmonary:     Effort: Pulmonary effort is normal. No accessory muscle usage or respiratory distress.     Breath sounds: Normal breath sounds and air entry. No stridor, decreased air movement or transmitted upper airway sounds. No decreased breath sounds, wheezing, rhonchi or rales.     Comments: No cough observed in exam room; spoke full sentences without difficulty; wearing cloth mask due to covid 19 pandemic Chest:     Chest wall: No tenderness.  Abdominal:     General: Abdomen is flat. Bowel sounds are decreased. There is no distension.     Palpations: Abdomen is soft.     Tenderness: There is no abdominal tenderness. There is no right CVA tenderness, left CVA tenderness or guarding. Negative signs include Murphy's sign.     Hernia: No hernia is present. There is no hernia in the umbilical area or ventral area.  Musculoskeletal:        General: Tenderness present. No swelling or  deformity.     Right shoulder: Normal.     Left shoulder: Normal.     Right elbow: Normal.     Left elbow: Normal.     Right wrist: Normal.     Left wrist: Normal.     Right hand: Normal.     Left hand: Normal.     Cervical back: Normal, normal range of motion and neck supple. No swelling, edema, deformity, erythema, signs of trauma, rigidity, spasms, torticollis, tenderness, bony tenderness or crepitus. No pain with movement, spinous process tenderness or muscular tenderness. Normal range of motion.     Thoracic back: Normal.     Lumbar back: Normal.     Right hip: Normal.     Left hip: Normal.     Right knee: Swelling and effusion present. No deformity, erythema, ecchymosis, lacerations, bony tenderness or crepitus. Decreased range of motion. Tenderness present over the patellar tendon. No medial joint line, lateral joint line, MCL, LCL, ACL or PCL tenderness. No LCL laxity, MCL laxity, ACL  laxity or PCL laxity. Normal alignment, normal meniscus and normal patellar mobility. Normal pulse.     Left knee: Normal.     Right lower leg: No edema.     Left lower leg: No edema.       Legs:     Comments: No crepitus/locking or popping with AROM/PROM; gait sure and steady in hallway no limb observed/on/off exam table without difficulty  Lymphadenopathy:     Head:     Right side of head: No submental, submandibular, tonsillar, preauricular, posterior auricular or occipital adenopathy.     Left side of head: No submental, submandibular, tonsillar, preauricular, posterior auricular or occipital adenopathy.     Cervical: No cervical adenopathy.     Right cervical: No superficial, deep or posterior cervical adenopathy.    Left cervical: No superficial, deep or posterior cervical adenopathy.  Skin:    General: Skin is warm and dry.     Capillary Refill: Capillary refill takes less than 2 seconds.     Coloration: Skin is not ashen, cyanotic, jaundiced, mottled, pale or sallow.     Findings: No abrasion, abscess, acne, bruising, burn, ecchymosis, erythema, signs of injury, laceration, lesion, petechiae, rash or wound.     Nails: There is no clubbing.  Neurological:     General: No focal deficit present.     Mental Status: She is alert and oriented to person, place, and time. Mental status is at baseline. She is not disoriented.     GCS: GCS eye subscore is 4. GCS verbal subscore is 5. GCS motor subscore is 6.     Cranial Nerves: Cranial nerves are intact. No cranial nerve deficit, dysarthria or facial asymmetry.     Sensory: Sensation is intact. No sensory deficit.     Motor: Motor function is intact. No weakness, tremor, atrophy, abnormal muscle tone or seizure activity.     Coordination: Coordination is intact. Coordination normal.     Gait: Gait is intact. Gait normal.     Comments: Bilateral upper and lower extremity strength equal 5/5; on/off exam table and in/out of chair without  difficulty  Psychiatric:        Attention and Perception: Attention and perception normal.        Mood and Affect: Mood and affect normal.        Speech: Speech normal.        Behavior: Behavior normal. Behavior is cooperative.  Thought Content: Thought content normal.        Cognition and Memory: Cognition and memory normal.        Judgment: Judgment normal.      Reviewed labs results and given printed copy with instructions.  Patient reported she reviewed my chart messages also.  Elevated liver enzymes/cholesterol, LDH and lipids worsening previous Sep 2020 labs [facility] but improved from [facility] 2019 labs.  Patient taking crestor every day and her chronic medications may be cause of elevated liver enzymes. I recommend avoid weight gain, exercise 150 minutes per week; dietary fiber daily by mouth 20 grams women per up to date; eat whole grains/fruits/vegetables; keep added sugars to less than 100 calories/ 5 teaspoons for women per American [employer] Association; electrolytes, iron, kidney function, thyroid and complete blood count normal   Sinus pain maxillary bilateral TMs with air fluid level slight erythema TM bilateral allergic shiners     Assessment & Plan:  A-acute right knee pain, screening for colon cancer, screening for breast cancer, elevated LFTs, hyperlipidemia, ADD  P- RICE, knee compression sleeve may be helpful.  Discussed tendonitis versus bakers cyst from altered gait pre/post left hip surgery/new training schedule started 3 weeks ago for [employer] Jun 2021 in [place].  May alternate tylenol 1000mg  po q6h prn pain and motrin 800mg  po TID prn pain take with food and ensure hydrating to keep urine pale clear yellow.  If not improving follow up with orthopedics/imaging.  Patient verbalized understanding information/instructions, agreed with plan of care and had no further questions at this time.  Check with [employer] to see if her provider still network otherwise choose another  provider and reschedule her colon cancer screening which was delayed last year due to covid pandemic halting elective procedures.  Patient has mammogram scheduled saw GYN for annual exam/PAP last month.  Keep her scheduled appt.  Continue crestor po daily may be exacerbating elevated LFTs.  Activity had been decreased until recently pending her hip surgery Nov 2020 and she has only been back to her 5 days per week exercise x 3-4 weeks.  Repeat lipids in 12 months and consider LFTs in 6 months especially if requiring long term use tylenol/nsaids for continued knee pain. Discussed pain medication also could have increased LFTs and now just getting back into her preferred exercise routine.  Patient verbalized understanding information/instructions, agreed with plan of care and had no further questions at this time.  Mammogram pending/scheduled Mar 2021  Add nasal saline 2 sprays each nostril q2h wa prn congestion/pressure sinuses to flonase 1 spray each nostril BID and asteline 0.1% 1 spray each notril BID prn.  Use saline before medicated nose sprays and in shower.  Ayr gel for nostril dryness/irritation topical daily prn.  Patient refused steroid taper at this time and completed doxycycline course from [facility] in the past month.  Has noticed more runny nose in the last 45 days.  Discussed vasomotor rhinitis due to cold weather/HVAC.  Patient verbalized understanding information/instructions, agreed with plan of care and had no further questions at this time.  Diastolic BP elevated but acute pain today 5/10 will continue to monitor on subsequent office visits as patient increasing her activity/exercise also.  Denied vision changes/chest pain/headaches not related to sinuses.  Patient agreed with plan of care and had no further questions at this time.

## 2019-10-12 ENCOUNTER — Ambulatory Visit
Admission: RE | Admit: 2019-10-12 | Discharge: 2019-10-12 | Disposition: A | Discharge status: Home / Self Care | Payer: 59 | Source: Ambulatory Visit | Attending: Obstetrics and Gynecology | Admitting: Obstetrics and Gynecology

## 2019-10-12 DIAGNOSIS — Z1231 Encounter for screening mammogram for malignant neoplasm of breast: Secondary | ICD-10-CM | POA: Insufficient documentation

## 2019-10-12 DIAGNOSIS — Z1239 Encounter for other screening for malignant neoplasm of breast: Secondary | ICD-10-CM

## 2019-11-14 ENCOUNTER — Ambulatory Visit: Payer: 59 | Admitting: Physician Assistant

## 2019-11-14 ENCOUNTER — Other Ambulatory Visit: Payer: Self-pay

## 2019-11-14 ENCOUNTER — Encounter: Payer: Self-pay | Admitting: Physician Assistant

## 2019-11-14 VITALS — BP 120/80 | HR 72 | Temp 98.0°F | Resp 12

## 2019-11-14 DIAGNOSIS — S63501A Unspecified sprain of right wrist, initial encounter: Secondary | ICD-10-CM

## 2019-11-14 DIAGNOSIS — S66911A Strain of unspecified muscle, fascia and tendon at wrist and hand level, right hand, initial encounter: Secondary | ICD-10-CM

## 2019-11-14 NOTE — Progress Notes (Signed)
   Subjective: Right wrist pain    Patient ID: Jenna Holt, female    DOB: 1971/07/11, 49 y.o.   MRN: [id number]  HPI Patient presents with pelvis pain secondary to hyperextension injury which occurred prior to arrival.  Patient states she was expecting an automatic door to open the locking mechanism.  Patient state when she pushed on the door of the delay in the locking mechanism causing hyperextension and pain to the right wrist.  Patient denies loss of sensation.  Patient the pain increases with extension of the wrist.  Patient rates pain as 8/10.  Patient described pain is "achy".  No palliative measure prior to arrival.   Review of Systems    ADD and hyperlipidemia. Objective:   Physical Exam No acute distress.  No obvious deformity to the right wrist.  Neurovascularly intact.  Patient has decreased range of motion limited by complaint of pain with extension of the wrist.       Assessment & Plan: Sprain right wrist.  Patient given discharge care instruction.  Patient placed in the wrist support.  Given ibuprofen 600 mg 3 times daily for 5 days.  Patient return back in 5 days if no improvement or worsening complaint.

## 2019-11-14 NOTE — Progress Notes (Signed)
DOI:  11/14/19 Right wrist pain from pushing on door that has a delayed door opening.  No NSAIDS or ice.  Achy pain that starts at wrist & is radiating upwards of right forearm.  AMD

## 2019-11-27 ENCOUNTER — Other Ambulatory Visit: Payer: Self-pay | Admitting: Registered Nurse

## 2019-11-27 NOTE — Telephone Encounter (Signed)
COB pt. Thanks!

## 2019-11-28 ENCOUNTER — Encounter: Payer: Self-pay | Admitting: Registered Nurse

## 2019-11-28 NOTE — Telephone Encounter (Signed)
Last physical with me 09/16/2019 and saw PA Smith on 11/14/2019 for wrist pain.  BP 120/80 HR 72  Electronic Rx to her pharmacy of choice flexeril 10mg  po qhs prn hip spasms #30 RF0  [facility] notes not available in care everywhere at this time for review in Epic.

## 2020-02-15 ENCOUNTER — Other Ambulatory Visit: Payer: Self-pay

## 2020-02-15 ENCOUNTER — Ambulatory Visit: Payer: Self-pay | Admitting: Physician Assistant

## 2020-02-15 ENCOUNTER — Encounter: Payer: Self-pay | Admitting: Physician Assistant

## 2020-02-15 VITALS — BP 124/81 | HR 80 | Temp 97.7°F | Resp 12 | Ht 69.0 in | Wt 170.0 lb

## 2020-02-15 DIAGNOSIS — J020 Streptococcal pharyngitis: Secondary | ICD-10-CM

## 2020-02-15 DIAGNOSIS — J069 Acute upper respiratory infection, unspecified: Secondary | ICD-10-CM

## 2020-02-15 LAB — POCT RAPID STREP A (OFFICE): Rapid Strep A Screen: NEGATIVE

## 2020-02-15 MED ORDER — CYCLOBENZAPRINE HCL 10 MG PO TABS: 10.0000 mg | ORAL_TABLET | Freq: Three times a day (TID) | ORAL | 0 refills | Status: DC | PRN

## 2020-02-15 MED ORDER — FEXOFENADINE-PSEUDOEPHED ER 60-120 MG PO TB12: 1.0000 | ORAL_TABLET | Freq: Two times a day (BID) | ORAL | 0 refills | Status: DC

## 2020-02-15 MED ORDER — ROSUVASTATIN CALCIUM 20 MG PO TABS: 20.0000 mg | ORAL_TABLET | Freq: Every day | ORAL | 3 refills | Status: DC

## 2020-02-15 NOTE — Progress Notes (Signed)
Left eye was swollen this morning, left ear feels muffled & left side of throat hurts.  S/Sx started couple of days ago & yesterday went home early from work feeling tired.  Takes OTC allergy medication & drops for her nose.  Requesting a Rx refill for Flexeril.  AMD

## 2020-02-15 NOTE — Progress Notes (Signed)
   Subjective: Ear pain and sore throat    Patient ID: Jenna Holt, female    DOB: [birth year], 49 y.o.   MRN: [id number]  HPI Patient presents with left ear pain, decreased hearing, and sore throat.  Patient continues to have pain to the right wrist secondary to work-related injury 4 months ago.  Patient states pain increased with flexion-extension of the wrist.  Patient also requests refills of her maintenance medications.   Review of Systems    Hyperlipidemia and right wrist pain. Objective:   Physical Exam  No acute distress.  HEENT was remarkable for edematous but nonerythematous left TM.  Patient also has sinus guarding with palpation.  Edematous nasal turbinates.  Erythematous pharynx without exudates.  No cervical adenopathy.  Negative rapid strep test.  No obvious deformity to the right wrist.  Patient has decreased range of motion with extension and flexion of the wrist.      Assessment & Plan: Viral pharyngitis and a station tube dysfunction  Discussed etiology of sore throat or ear pain which can be caused by a viral or allergy component.  Patient given a prescription for fexofenadine with Sudafed.  Patient also had a Crestor and Flexeril refilled.  Patient will get a consult for physical therapy.  Plan evaluation and treatment for chronic wrist pain.

## 2020-03-11 ENCOUNTER — Encounter: Payer: Self-pay | Admitting: Emergency Medicine

## 2020-03-11 ENCOUNTER — Other Ambulatory Visit: Payer: Self-pay

## 2020-03-11 ENCOUNTER — Ambulatory Visit: Payer: Self-pay | Admitting: Emergency Medicine

## 2020-03-11 VITALS — BP 124/87 | HR 72 | Temp 97.5°F | Resp 12 | Ht 69.0 in | Wt 167.0 lb

## 2020-03-11 DIAGNOSIS — R21 Rash and other nonspecific skin eruption: Secondary | ICD-10-CM

## 2020-03-11 DIAGNOSIS — Z0182 Encounter for allergy testing: Secondary | ICD-10-CM

## 2020-03-11 MED ORDER — FEXOFENADINE HCL 60 MG PO TABS: 60.0000 mg | ORAL_TABLET | Freq: Two times a day (BID) | ORAL | Status: DC

## 2020-03-11 MED ORDER — PREDNISONE 10 MG (21) PO TBPK: ORAL_TABLET | ORAL | 0 refills | Status: DC

## 2020-03-11 NOTE — Progress Notes (Signed)
Left eyellid slightly reddened & mild swelling - states was more & started 2 - 3 weeks ago.   Tried cortisone & different moisturizers.  No changes in hygiene products or cleaning products.  States ever had any drainage - just scaly & slightly swollen.  Skin underneath right eye swollen one day last week  C/O corners of her mouth getting red - not at present.  Hasn't eaten anything out of the ordinary & no seafood in the past few weeks.  Has had similar issue in the past, but it's been quite awhile since anything has flared up.  Has been to a dermatologist in the past, but she's never caught it at the visit.  AMD

## 2020-03-11 NOTE — Progress Notes (Signed)
  Occupational Health Provider Note       Time seen: 1:18 PM   I have reviewed the vital signs and the nursing notes.  HISTORY   Chief Complaint Rash (Left Eye)   HPI Jenna Holt Pluff Jenna Holt is a 49 y.o. female with a history of hyperlipidemia who presents today for facial rash.  Patient has noticed eyelid swelling, particular in the left eyelid with some scaling.  She has tried cortisone and different moisturizers with no improvement except over this past weekend.  Right eyelid was swollen 1 day last week.  Past Medical History:  Diagnosis Date  . High cholesterol     Past Surgical History:  Procedure Laterality Date  . AUGMENTATION MAMMAPLASTY Bilateral    2013  . HIP SURGERY    . SHOULDER ARTHROSCOPY      Allergies Latex, Penicillins, and Naproxen   Review of Systems Constitutional: Negative for fever. Cardiovascular: Negative for chest pain. Respiratory: Negative for shortness of breath. Skin: Positive for facial rash Neurological: Negative for headaches, focal weakness or numbness.  All systems negative/normal/unremarkable except as stated in the HPI  ____________________________________________   PHYSICAL EXAM:  VITAL SIGNS: Vitals:   03/11/20 1303  BP: 124/87  Pulse: 72  Resp: 12  Temp: (!) 97.5 F (36.4 C)  SpO2: 99%    Constitutional: Alert and oriented. Well appearing and in no distress. Eyes: Conjunctivae are normal.  Mild left eyelid swelling, scattered areas of facial erythema Musculoskeletal: Nontender with normal range of motion in extremities. No lower extremity tenderness nor edema. Neurologic:  Normal speech and language. No gross focal neurologic deficits are appreciated.  Skin: Facial erythema as noted above Psychiatric: Speech and behavior are normal.  ____________________________________________   LABS (pertinent positives/negatives)  Recent Results (from the past 2160 hour(s))  POCT rapid strep A     Status: None    Collection Time: 02/15/20  2:33 PM  Result Value Ref Range   Rapid Strep A Screen Negative Negative    ASSESSMENT AND PLAN  Facial rash, seasonal allergy   Plan: The patient had presented for a facial rash.  Patient appears to  have very sensitive skin, has this intermittent rash on her face with eyelid swelling.  This is likely seasonal allergy of uncertain etiology.  I will place her back on Allegra and try a steroid taper.  She is cleared for outpatient follow-up.  Lenise Arena MD    Note: This note was generated in part or whole with voice recognition software. Voice recognition is usually quite accurate but there are transcription errors that can and very often do occur. I apologize for any typographical errors that were not detected and corrected.

## 2020-03-11 NOTE — Addendum Note (Signed)
Addended by: Aliene Altes on: 03/11/2020 01:28 PM   Modules accepted: Orders

## 2020-05-12 ENCOUNTER — Ambulatory Visit (INDEPENDENT_AMBULATORY_CARE_PROVIDER_SITE_OTHER): Payer: 59 | Admitting: Dermatology

## 2020-05-12 ENCOUNTER — Other Ambulatory Visit: Payer: Self-pay

## 2020-05-12 DIAGNOSIS — L738 Other specified follicular disorders: Secondary | ICD-10-CM | POA: Diagnosis not present

## 2020-05-12 DIAGNOSIS — B353 Tinea pedis: Secondary | ICD-10-CM

## 2020-05-12 DIAGNOSIS — L309 Dermatitis, unspecified: Secondary | ICD-10-CM

## 2020-05-12 DIAGNOSIS — R21 Rash and other nonspecific skin eruption: Secondary | ICD-10-CM

## 2020-05-12 DIAGNOSIS — D1801 Hemangioma of skin and subcutaneous tissue: Secondary | ICD-10-CM

## 2020-05-12 MED ORDER — EUCRISA 2 % EX OINT: 1.0000 "application " | TOPICAL_OINTMENT | CUTANEOUS | 2 refills | Status: DC

## 2020-05-12 MED ORDER — CICLOPIROX OLAMINE 0.77 % EX CREA: TOPICAL_CREAM | Freq: Two times a day (BID) | CUTANEOUS | 2 refills | Status: DC

## 2020-05-12 NOTE — Progress Notes (Signed)
   New Patient Visit  Subjective  Jenna Holt is a 49 y.o. female who presents for the following: Dermatitis (eyelids, chin ~80m, swelling, flaky, otc HC, cerave, pt was using retional and stopped due to dryness, no hx of asthma, pt is getting tested for allergies with Dr. Tami Ribas and no allergies so far), clear bumps (nose, >21yrs), scaling feet (bil feet, was itchy, using tenactin x 2 wks, no improvement), and bump (L nostril, hx of bleeding when rubbed).  Patient referred by Dr. Beverly Gust.  The following portions of the chart were reviewed this encounter and updated as appropriate:      Review of Systems:  No other skin or systemic complaints except as noted in HPI or Assessment and Plan.  Objective  Well appearing patient in no apparent distress; mood and affect are within normal limits.  A focused examination was performed including face, neck, hands, feet. Relevant physical exam findings are noted in the Assessment and Plan.  Objective  bil eyelids: Eyelids with erythema and scaling  Objective  bil hands: mild erythema and scale bil 4th webspace's hands, bil erythema [name]  Objective  bil feet: Hyperkeratosis bil mid plantar feet, webspace's clear,   Objective  L nasal ala: Yellow pap  Objective  L medial inner nostril: Tiny red papule, pt denies bleeding, crusting   Assessment & Plan  Eczema, unspecified type bil eyelids  Eyelid dermatitis Start Eucrisa bid aa rash eyelids, face, samples x 3 given Lot SFEF 12/2022 D/C other products to face Recommend starting Vanicream cleanser and moisturizer  Samples of Cerave hydrating sunscreen, Neutrogena sunscreen, recommend physical sunblock (I.e Zinc)  Crisaborole (EUCRISA) 2 % OINT - bil eyelids  Rash bil hands  Hand dermatitis vs other  Start Eucrisa oint qd/bid until clear, then prn flares  Recommend mild soap and routine use of moisturizing cream after handwashing.  Minimize soap/water  exposure when possible.     Other Related Medications fexofenadine (ALLEGRA) tablet 60 mg  Tinea pedis of both feet bil feet  Start Ciclopirox cream bid to feet/toes for 2 weeks, if not improving then start the Eucrisa oint bid  Start Amlactin 12% cream or Eucerin roughness relief cream qd to feet  ciclopirox (LOPROX) 0.77 % cream - bil feet  Sebaceous hyperplasia L nasal ala  Benign, observe  Discussed ED  Hemangioma of skin L medial inner nostril  Benign, observe  Return in about 1 month (around 06/12/2020).   I, Jenna Holt, RMA, am acting as scribe for Jenna Patty, MD . Documentation: I have reviewed the above documentation for accuracy and completeness, and I agree with the above.  Jenna Patty MD

## 2020-05-13 NOTE — Addendum Note (Signed)
Addended by: [name] on: 05/13/2020 01:09 PM   Modules accepted: Level of Service

## 2020-06-11 ENCOUNTER — Ambulatory Visit: Payer: Self-pay

## 2020-06-11 DIAGNOSIS — Z23 Encounter for immunization: Secondary | ICD-10-CM

## 2020-06-24 ENCOUNTER — Ambulatory Visit: Payer: 59 | Admitting: Dermatology

## 2020-07-01 ENCOUNTER — Other Ambulatory Visit: Payer: Self-pay

## 2020-07-01 DIAGNOSIS — Z1152 Encounter for screening for COVID-19: Secondary | ICD-10-CM

## 2020-07-03 LAB — SARS-COV-2, NAA 2 DAY TAT

## 2020-07-03 LAB — NOVEL CORONAVIRUS, NAA: SARS-CoV-2, NAA: NOT DETECTED

## 2020-07-11 ENCOUNTER — Other Ambulatory Visit: Payer: Self-pay | Admitting: Physician Assistant

## 2020-07-11 DIAGNOSIS — G8929 Other chronic pain: Secondary | ICD-10-CM

## 2020-07-15 ENCOUNTER — Telehealth: Payer: Self-pay

## 2020-07-15 NOTE — Telephone Encounter (Signed)
Rx refill request already in Epic - re-routed to Ron Smith, PA-C.  AMD 

## 2020-07-17 ENCOUNTER — Ambulatory Visit: Payer: No Typology Code available for payment source | Admitting: Dermatology

## 2020-08-15 ENCOUNTER — Ambulatory Visit: Payer: Self-pay | Admitting: Physician Assistant

## 2020-08-15 ENCOUNTER — Other Ambulatory Visit: Payer: Self-pay

## 2020-08-15 VITALS — BP 129/77 | HR 71 | Temp 98.2°F | Resp 16 | Ht 69.0 in | Wt 169.0 lb

## 2020-08-15 DIAGNOSIS — Z1152 Encounter for screening for COVID-19: Secondary | ICD-10-CM

## 2020-08-15 DIAGNOSIS — B001 Herpesviral vesicular dermatitis: Secondary | ICD-10-CM

## 2020-08-15 MED ORDER — ACYCLOVIR 5 % EX OINT: 1.0000 "application " | TOPICAL_OINTMENT | CUTANEOUS | 2 refills | Status: DC

## 2020-08-15 NOTE — Progress Notes (Signed)
   Subjective: Cold sores    Patient ID: Jenna Holt, female    DOB: [birth year], 50 y.o.   MRN: [id number]  HPI Patient presents for outbreak of cold sores to the upper and lower lips.  Patient states this is happens whenever she becomes very stressed.  Patient is questioning a prescription for acyclovir as helped in the past.  Patient also has to restart Adderall due to her decreased attention to detail this required her job.  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  She states she is missing information that is need to process this paperwork.  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  Patient state she has taken Adderall for many years but this has been DC'd last year after discussion with Dr. Koleen Nimrod.   Review of Systems    Hyperlipidemia Objective:   Physical Exam  No acute distress.  Visible vesicle lesions upper and lower oral lips.      Assessment & Plan: Herpes simplex  Patient given a prescription for acyclovir.  Patient advised to schedule appointment with a new clinic doctor to discuss Adderall.

## 2020-08-15 NOTE — Addendum Note (Signed)
Addended by: Malachy Moan F on: 08/15/2020 01:10 PM   Modules accepted: Orders

## 2020-08-15 NOTE — Progress Notes (Signed)
Pt needs refill on acyclovir ointment. CL,RMA

## 2020-08-19 ENCOUNTER — Ambulatory Visit: Payer: 59 | Admitting: Physician Assistant

## 2020-08-19 LAB — NOVEL CORONAVIRUS, NAA: SARS-CoV-2, NAA: NOT DETECTED

## 2020-08-27 ENCOUNTER — Ambulatory Visit: Payer: 59 | Admitting: Adult Medicine

## 2020-09-03 ENCOUNTER — Other Ambulatory Visit: Payer: Self-pay

## 2020-09-03 ENCOUNTER — Ambulatory Visit: Payer: Self-pay | Admitting: Adult Medicine

## 2020-09-03 ENCOUNTER — Encounter: Payer: Self-pay | Admitting: Adult Medicine

## 2020-09-03 VITALS — BP 120/83 | HR 88 | Temp 98.6°F | Resp 12

## 2020-09-03 DIAGNOSIS — F988 Other specified behavioral and emotional disorders with onset usually occurring in childhood and adolescence: Secondary | ICD-10-CM

## 2020-09-03 DIAGNOSIS — H9201 Otalgia, right ear: Secondary | ICD-10-CM

## 2020-09-03 NOTE — Progress Notes (Signed)
I have reviewed the triage vital signs and the nursing notes.   HISTORY Chief Complaint Medication Management (Requesting to go back on Adderall - PA advised patient to return to clinic to discuss with [facility].) once  HPI Jenna Holt is a 50 y.o. 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 High cholesterol     Patient Active Problem List   Diagnosis Date Noted  . ADD (attention deficit disorder) 05/15/2019  . Allergy 05/15/2019  . Hyperlipidemia 05/15/2019  . Elevated BP without diagnosis of hypertension 04/12/2019  . Colon cancer screening 01/03/2019  . Slow transit constipation 01/03/2019  . Chronic left hip pain 08/23/2017  . Insomnia 11/05/2013    Past Surgical History:  Procedure Laterality Date  . AUGMENTATION MAMMAPLASTY Bilateral    2013  . HIP SURGERY    . SHOULDER ARTHROSCOPY      Prior to Admission medications   Medication Sig Start Date End Date Taking? Authorizing Provider  acyclovir ointment (ZOVIRAX) 5 % Apply 1 application topically every 3 (three) hours. 08/15/20  Yes Sable Feil, PA-C  Crisaborole (EUCRISA) 2 % OINT Apply 1 application topically as directed. Qd to bid aa rash on eyelids, face, hands 05/12/20  Yes [name], MD  rosuvastatin (CRESTOR) 20 MG tablet Take 1 tablet (20 mg total) by mouth daily. 02/15/20  Yes Sable Feil, PA-C  acyclovir ointment (ZOVIRAX) 5 % Apply 1 application topically as needed. 09/05/19   [provider]   amitriptyline (ELAVIL) 50 MG tablet Take 50 mg by mouth at bedtime. 08/22/19   [provider]  ciclopirox (LOPROX) 0.77 % cream Apply topically 2 (two) times daily. Bid to feet and toes 05/12/20   [name], MD  cyclobenzaprine (FLEXERIL) 10 MG tablet TAKE 1 TABLET BY MOUTH THREE TIMES A DAY AS NEEDED FOR MUSCLE SPASMS 07/16/20   Sable Feil, PA-C  mometasone (ELOCON) 0.1 % lotion SMARTSIG:In Ear(s) Twice Daily PRN Patient not taking: Reported on 09/03/2020 03/26/20   [provider]  rosuvastatin (CRESTOR) 20 MG tablet TAKE 1 TABLET BY MOUTH DAILY 08/22/19   Betancourt, Aura Fey, NP  traMADol (ULTRAM) 50 MG tablet  10/26/18   [provider]   Allergies Latex, Penicillins, and Naproxen  Family History  Problem Relation Age of Onset  . Breast cancer Maternal Grandmother        great grandmother  . Breast cancer Paternal Uncle        44's  . Heart attack Mother   . Lung cancer Father        smoker   Social History   Tobacco Use  . Smoking status: Never Smoker  . Smokeless tobacco: Never Used  Vaping Use  . Vaping Use: Never used  Substance Use Topics  . Alcohol use: Yes    Comment: occ  . Drug use: No   Review of Systems Constitutional: No fever/chills Eyes: No visual changes. ENT: No sore throat. Hx herpes sores Cardiovascular: Denies chest pain. Respiratory:  Denies shortness of breath. Gastrointestinal: No abdominal pain.  No nausea, no vomiting.   No diarrhea.  No constipation. Genitourinary: Negative for dysuria. Musculoskeletal: rx for back pain/spassm  Skin: Negative for rash. Neurological: Negative for headaches, focal weakness or numbness. Psychiatric: denies depression, but says she has always a bit anxious  seems more exacerbate in high sensory input environs ____________________________________________  PHYSICAL EXAM:  VITAL SIGNS: nl bmi o2 sat 96% Normotensive, pleasant WN WF unconsciously anxiously tapping  Hand to thigh   Constitutional: Alert and oriented. Well appearing and in no acute distress. Eyes: Conjunctivae are normal. PERRL. EOMI. Head: Atraumatic. Eyes perrla eom intact  Rt Ear otalgia  Nose: No congestion/rhinnorhea. Mouth/Throat: Mucous membranes are moist. Oropharynx non-erythematous. Neck: No stridor.  Lymphatic: rt retromandibular tender lymph node. Cardiovascular: Nsr Grossly normal heart sounds.  Good peripheral circulation. Respiratory: Normal respiratory effort.  Lungs CTAB. Gastrointestinal: Soft and nontender. No distention. No abdominal bruits. No CVA tenderness. Genitourinary: deferred Musculoskeletal: No lower extremity tenderness nor edema.   Neurologic:  Normal speech and language. No gross focal neurologic deficits are appreciated. No gait instability. Skin:  Skin is warm, dry and intact. No rash noted. Psychiatric: Mood and affect are normal. Speech pressured, behavior normal.  ____________________________________________   LABS LDH 119 - 226 IU/L 290High  236High   AST 0 - 40 IU/L 46High  32   ALT 0 - 32 IU/L 26  17   GGT 0 - 60 IU/L 13  10   Iron 27 - 159 ug/dL 131  136   Cholesterol, Total 100 - 199 mg/dL 243High  221High   Triglycerides 0 - 149 mg/dL 76  82   HDL >39 mg/dL 70  63   VLDL Cholesterol Cal 5 - 40 mg/dL 13  14   LDL Chol Calc (NIH) 0 - 99 mg/dL 160High  144High   Chol/HDL Ratio 0.0 - 4.4 ratio 3.5  3.5    Component Ref Range & Units 11 mo ago 1 yr ago   Procedures  Lt hip replacement+----------------------------- ____________________________________________   INITIAL IMPRESSION / ASSESSMENT AND PLAN  Hypervigilant client who has seen multiple police events which will need to be  reviewed for traumatic stress currently works in desk job reports some difficulty  completing tasks from stressful input. Some yrs back a [place] place client On adderrall 15mg  she has not been able to get records from this time. Discussed CBT technique  will trial x34m  Rt ear Otalgia  Acute exogeneous Eustachian tube dysfunction . Dizziness feeling as it lopsided worse from sitting lying to standing sitting up  Client reports resolved with manuevers.  Left without sxs  Perimenopause   Hyperlipidemia with fH with hdl70 may use consider add zetia, single rx liptor  Vs combination med.   R hip degenerative arithritis reported record with outside clinic Vitd levels unknown Client request to add these records to file. Pending record review may benefits from localized topical estrogen cream Client to f/u in 81m

## 2020-09-03 NOTE — Progress Notes (Signed)
Started working at [employer] in 2018 & had Hx of Add & was taking Adderall 15 mg qd.  10/2018 - Dr. Roxan Hockey - stopped taking Adderall after visit.  Completed the Rx that  Dr. Roxan Hockey gave her & stopped after the one he wrote for 11/2018.  Requesting to go back on Adderall. States difficulty concentrating at work & starting to affect her job performance. Came 08/15/20 to discuss & PA advised her to return to discuss with new MD.  C/O vertigo when gets up too fast along with some dizziness  AMD

## 2020-09-22 ENCOUNTER — Telehealth: Payer: Self-pay

## 2020-09-22 ENCOUNTER — Other Ambulatory Visit: Payer: Self-pay | Admitting: Obstetrics and Gynecology

## 2020-09-22 DIAGNOSIS — Z1231 Encounter for screening mammogram for malignant neoplasm of breast: Secondary | ICD-10-CM

## 2020-09-22 NOTE — Telephone Encounter (Signed)
Pap is still good if she is not having any gyn issues that fine order is in   [facility] [place] [address]  [facility]  [address] [place] [address]  Phone: [phone]

## 2020-09-22 NOTE — Telephone Encounter (Signed)
LMVM to relay per AMS last visit note:  Return in about 1 year (around 09/06/2020) for annual.   Advised annual exam still needed. Pap smear (screening for cervical cancer) is what is not needed annually at her age.  Advised to call to schedule apt. Sending msg to AMS for Mammogram order.

## 2020-09-22 NOTE — Telephone Encounter (Signed)
Patient received letter to schedule her annual. She understood AMS from her last apt that she didn't need an annual exam yearly due to her age. She does need an order for mammogram. Cb#[id number].

## 2020-10-14 ENCOUNTER — Other Ambulatory Visit: Payer: Self-pay | Admitting: Obstetrics and Gynecology

## 2020-10-14 DIAGNOSIS — Z1231 Encounter for screening mammogram for malignant neoplasm of breast: Secondary | ICD-10-CM

## 2020-10-14 NOTE — Telephone Encounter (Signed)
Patient aware mammogram order is in. She is not having any gyn issues and prefers not to come for AE apt scheduled for 10/16/20. Apt cancelled.

## 2020-10-16 ENCOUNTER — Ambulatory Visit: Payer: Managed Care, Other (non HMO) | Admitting: Obstetrics and Gynecology

## 2020-10-21 NOTE — Progress Notes (Signed)
Pt scheduled to complete phyhsical 10/28/20 with Apolonio Schneiders, FNP. CL,RMA

## 2020-10-22 ENCOUNTER — Ambulatory Visit: Payer: Self-pay

## 2020-10-22 ENCOUNTER — Other Ambulatory Visit: Payer: Self-pay

## 2020-10-22 DIAGNOSIS — Z01818 Encounter for other preprocedural examination: Secondary | ICD-10-CM

## 2020-10-22 LAB — POCT URINALYSIS DIPSTICK
Bilirubin, UA: NEGATIVE
Blood, UA: NEGATIVE
Glucose, UA: NEGATIVE
Ketones, UA: NEGATIVE
Leukocytes, UA: NEGATIVE
Nitrite, UA: NEGATIVE
Protein, UA: NEGATIVE
Spec Grav, UA: <=1.005 — AB (ref 1.010–1.025)
Urobilinogen, UA: 0.2 E.U./dL
pH, UA: 6 (ref 5.0–8.0)

## 2020-10-23 LAB — CMP12+LP+TP+TSH+6AC+CBC/D/PLT
ALT: 17 IU/L (ref 0–32)
AST: 34 IU/L (ref 0–40)
Albumin/Globulin Ratio: 2 (ref 1.2–2.2)
Albumin: 4.8 g/dL (ref 3.8–4.9)
Alkaline Phosphatase: 86 IU/L (ref 44–121)
BUN/Creatinine Ratio: 18 (ref 9–23)
BUN: 20 mg/dL (ref 6–24)
Basophils Absolute: 0.1 10*3/uL (ref 0.0–0.2)
Basos: 1 %
Bilirubin Total: 0.4 mg/dL (ref 0.0–1.2)
Calcium: 9.5 mg/dL (ref 8.7–10.2)
Chloride: 98 mmol/L (ref 96–106)
Chol/HDL Ratio: 3.3 ratio (ref 0.0–4.4)
Cholesterol, Total: 236 mg/dL — ABNORMAL HIGH (ref 100–199)
Creatinine, Ser: 1.13 mg/dL — ABNORMAL HIGH (ref 0.57–1.00)
EOS (ABSOLUTE): 0.1 10*3/uL (ref 0.0–0.4)
Eos: 1 %
Estimated CHD Risk: <0.5 times avg. (ref 0.0–1.0)
Free Thyroxine Index: 2.4 (ref 1.2–4.9)
GGT: 10 IU/L (ref 0–60)
Globulin, Total: 2.4 g/dL (ref 1.5–4.5)
Glucose: 88 mg/dL (ref 65–99)
HDL: 71 mg/dL (ref >39)
Hematocrit: 41.5 % (ref 34.0–46.6)
Hemoglobin: 13.9 g/dL (ref 11.1–15.9)
Immature Grans (Abs): 0 10*3/uL (ref 0.0–0.1)
Immature Granulocytes: 0 %
Iron: 109 ug/dL (ref 27–159)
LDH: 265 IU/L — ABNORMAL HIGH (ref 119–226)
LDL Chol Calc (NIH): 154 mg/dL — ABNORMAL HIGH (ref 0–99)
Lymphocytes Absolute: 2.1 10*3/uL (ref 0.7–3.1)
Lymphs: 28 %
MCH: 29.9 pg (ref 26.6–33.0)
MCHC: 33.5 g/dL (ref 31.5–35.7)
MCV: 89 fL (ref 79–97)
Monocytes Absolute: 0.8 10*3/uL (ref 0.1–0.9)
Monocytes: 10 %
Neutrophils Absolute: 4.6 10*3/uL (ref 1.4–7.0)
Neutrophils: 60 %
Phosphorus: 4.9 mg/dL — ABNORMAL HIGH (ref 3.0–4.3)
Platelets: 282 10*3/uL (ref 150–450)
Potassium: 4.3 mmol/L (ref 3.5–5.2)
RBC: 4.65 x10E6/uL (ref 3.77–5.28)
RDW: 12.8 % (ref 11.7–15.4)
Sodium: 140 mmol/L (ref 134–144)
T3 Uptake Ratio: 30 % (ref 24–39)
T4, Total: 8.1 ug/dL (ref 4.5–12.0)
TSH: 1.54 u[IU]/mL (ref 0.450–4.500)
Total Protein: 7.2 g/dL (ref 6.0–8.5)
Triglycerides: 65 mg/dL (ref 0–149)
Uric Acid: 4.4 mg/dL (ref 3.0–7.2)
VLDL Cholesterol Cal: 11 mg/dL (ref 5–40)
WBC: 7.6 10*3/uL (ref 3.4–10.8)
eGFR: 59 mL/min/{1.73_m2} — ABNORMAL LOW (ref >59)

## 2020-10-28 ENCOUNTER — Ambulatory Visit: Payer: Self-pay | Admitting: Nurse Practitioner

## 2020-10-28 ENCOUNTER — Other Ambulatory Visit: Payer: Self-pay

## 2020-10-28 ENCOUNTER — Encounter: Payer: Self-pay | Admitting: Nurse Practitioner

## 2020-10-28 ENCOUNTER — Ambulatory Visit: Payer: 59

## 2020-10-28 VITALS — BP 141/94 | HR 72 | Temp 98.5°F | Resp 12 | Ht 69.0 in | Wt 171.0 lb

## 2020-10-28 DIAGNOSIS — F909 Attention-deficit hyperactivity disorder, unspecified type: Secondary | ICD-10-CM

## 2020-10-28 DIAGNOSIS — Z1211 Encounter for screening for malignant neoplasm of colon: Secondary | ICD-10-CM

## 2020-10-28 DIAGNOSIS — G4700 Insomnia, unspecified: Secondary | ICD-10-CM

## 2020-10-28 DIAGNOSIS — J0140 Acute pansinusitis, unspecified: Secondary | ICD-10-CM

## 2020-10-28 DIAGNOSIS — R7989 Other specified abnormal findings of blood chemistry: Secondary | ICD-10-CM

## 2020-10-28 MED ORDER — DOXYCYCLINE HYCLATE 100 MG PO TABS
100.0000 mg | ORAL_TABLET | Freq: Two times a day (BID) | ORAL | 0 refills | Status: AC
Start: 2020-10-28 — End: 2020-11-04

## 2020-10-28 MED ORDER — AMITRIPTYLINE HCL 50 MG PO TABS
50.0000 mg | ORAL_TABLET | Freq: Every evening | ORAL | 2 refills | Status: DC | PRN
Start: 2020-10-28 — End: 2021-09-21

## 2020-10-28 NOTE — Progress Notes (Signed)
Subjective:    Patient ID: Jenna Holt, female    DOB: 04-20-1971, 50 y.o.   MRN: [id number]  HPI  50 year old female presenting for annual physical today   Also complaints of sinus symptoms for the past week, has taken two at home COVID tests that were negative. She has been using OTC allergy and cold medication without relief. Worsening sinus congestion, no chest congestion or cough. Denies fever or other systemic symptoms.  Patient has a history of ADHD was managed on Adderrall when she was living in [place]. 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 Transitioning to desk work has made her notice her ADHD symptoms again and is having a difficult time concentrating on her work. She was seen by Dr. Noreene Larsson in January of 2022 and was given some distraction techniques to practice that have not worked for her. She is open to a psychiatric evaluation and understand that is the current protocol of the [facility].     She has suffered from sleep apnea in the past and uses amitriptyline as needed for sleep and states that this works well when she needs it.   Has a history of chronic back pain for which at times she needs flexeril but this is not often.   She has been on Crestor since 2020 for hyperlipidemia. Historically patient was on Lipitor in the past and this caused a bump in her kidney function so she was taken off of it.   She has not had a colonoscopy, was scheduled during start of pandemic and the procedure was canceled by the scheduling office due to COVID-19.    Patient has been training for an upcoming race. Has been using "pre workout" for the past month.   No other major changes to diet, she is very conscious of water intake and stays hydrated throughout the day  Today's Vitals   10/28/20 1528  BP: (!) 141/94  Pulse: 72  Resp: 12  Temp: 98.5 F (36.9 C)  TempSrc: Oral  SpO2: 97%  Weight: 171 lb  (77.6 kg)  Height: _0  (1.753 m)   Body mass index is 25.25 kg/m.  BP retaken with manual cuff by provider. Left arm while patient was seated 132/90.    Review of Systems  Constitutional: Negative for fever.  HENT: Positive for congestion, sinus pressure and sinus pain.   Eyes: Negative.   Respiratory: Negative.   Cardiovascular: Negative.   Genitourinary: Negative.   Musculoskeletal: Positive for back pain.  Skin: Negative.   Psychiatric/Behavioral: Positive for decreased concentration.       Objective:   Physical Exam HENT:     Head: Normocephalic.     Right Ear: Hearing, ear canal and external ear normal. A middle ear effusion is present. Tympanic membrane is not injected.     Left Ear: Hearing, ear canal and external ear normal. A middle ear effusion is present. Tympanic membrane is not injected.     Nose: Mucosal edema, congestion and rhinorrhea present.     Right Turbinates: Swollen.     Left Turbinates: Swollen.     Right Sinus: Maxillary sinus tenderness and frontal sinus tenderness present.     Left Sinus: Maxillary sinus tenderness and frontal sinus tenderness present.     Mouth/Throat:     Lips: Pink.     Mouth: Mucous membranes are moist.     Pharynx: Oropharynx  is clear. No oropharyngeal exudate or posterior oropharyngeal erythema.  Eyes:     Pupils: Pupils are equal, round, and reactive to light.  Cardiovascular:     Rate and Rhythm: Normal rate and regular rhythm.     Heart sounds: Normal heart sounds.  Pulmonary:     Effort: Pulmonary effort is normal.     Breath sounds: Normal breath sounds.  Abdominal:     General: Abdomen is flat.     Tenderness: There is no right CVA tenderness or left CVA tenderness.  Musculoskeletal:     Cervical back: Normal range of motion.  Skin:    General: Skin is warm and dry.     Capillary Refill: Capillary refill takes less than 2 seconds.  Neurological:     General: No focal deficit present.     Mental Status:  She is alert.  Psychiatric:        Mood and Affect: Mood normal.     Recent Results (from the past 2160 hour(s))  Novel Coronavirus, NAA ([facility])     Status: None   Collection Time: 08/15/20  9:45 AM   Specimen: Nasopharyngeal(NP) swabs in vial transport medium   Nasopharynge  Is this  Result Value Ref Range   SARS-CoV-2, NAA Not Detected Not Detected    Comment: This nucleic acid amplification test was developed and its performance characteristics determined by [employer]. Nucleic acid amplification tests include RT-PCR and TMA. This test has not been FDA cleared or approved. This test has been authorized by FDA under an Emergency Use Authorization (EUA). This test is only authorized for the duration of time the declaration that circumstances exist justifying the authorization of the emergency use of in vitro diagnostic tests for detection of SARS-CoV-2 virus and/or diagnosis of COVID-19 infection under section 564(b)(1) of the Act, 21 U.S.C. 825OIB-7(C) (1), unless the authorization is terminated or revoked sooner. When diagnostic testing is negative, the possibility of a false negative result should be considered in the context of a patient's recent exposures and the presence of clinical signs and symptoms consistent with COVID-19. An individual without symptoms of COVID-19 and who is not shedding SARS-CoV-2 virus wo uld expect to have a negative (not detected) result in this assay.   CMP12+LP+TP+TSH+6AC+CBC/D/Plt     Status: Abnormal   Collection Time: 10/22/20  8:30 AM  Result Value Ref Range   Glucose 88 65 - 99 mg/dL   Uric Acid 4.4 3.0 - 7.2 mg/dL    Comment:            Therapeutic target for gout patients: <6.0   BUN 20 6 - 24 mg/dL   Creatinine, Ser 1.13 (H) 0.57 - 1.00 mg/dL   eGFR 59 (L) >59 mL/min/1.73   BUN/Creatinine Ratio 18 9 - 23   Sodium 140 134 - 144 mmol/L   Potassium 4.3 3.5 - 5.2 mmol/L   Chloride 98 96 - 106 mmol/L   Calcium 9.5 8.7 -  10.2 mg/dL   Phosphorus 4.9 (H) 3.0 - 4.3 mg/dL   Total Protein 7.2 6.0 - 8.5 g/dL   Albumin 4.8 3.8 - 4.9 g/dL   Globulin, Total 2.4 1.5 - 4.5 g/dL   Albumin/Globulin Ratio 2.0 1.2 - 2.2   Bilirubin Total 0.4 0.0 - 1.2 mg/dL   Alkaline Phosphatase 86 44 - 121 IU/L   LDH 265 (H) 119 - 226 IU/L   AST 34 0 - 40 IU/L   ALT 17 0 - 32 IU/L   GGT  10 0 - 60 IU/L   Iron 109 27 - 159 ug/dL   Cholesterol, Total 236 (H) 100 - 199 mg/dL   Triglycerides 65 0 - 149 mg/dL   HDL 71 >39 mg/dL   VLDL Cholesterol Cal 11 5 - 40 mg/dL   LDL Chol Calc (NIH) 154 (H) 0 - 99 mg/dL   Chol/HDL Ratio 3.3 0.0 - 4.4 ratio    Comment:                                   T. Chol/HDL Ratio                                             Men  Women                               1/2 Avg.Risk  3.4    3.3                                   Avg.Risk  5.0    4.4                                2X Avg.Risk  9.6    7.1                                3X Avg.Risk 23.4   11.0    Estimated CHD Risk  < 0.5 0.0 - 1.0 times avg.    Comment: The CHD Risk is based on the T. Chol/HDL ratio. Other factors affect CHD Risk such as hypertension, smoking, diabetes, severe obesity, and family history of premature CHD.    TSH 1.540 0.450 - 4.500 uIU/mL   T4, Total 8.1 4.5 - 12.0 ug/dL   T3 Uptake Ratio 30 24 - 39 %   Free Thyroxine Index 2.4 1.2 - 4.9   WBC 7.6 3.4 - 10.8 x10E3/uL   RBC 4.65 3.77 - 5.28 x10E6/uL   Hemoglobin 13.9 11.1 - 15.9 g/dL   Hematocrit 41.5 34.0 - 46.6 %   MCV 89 79 - 97 fL   MCH 29.9 26.6 - 33.0 pg   MCHC 33.5 31.5 - 35.7 g/dL   RDW 12.8 11.7 - 15.4 %   Platelets 282 150 - 450 x10E3/uL   Neutrophils 60 Not Estab. %   Lymphs 28 Not Estab. %   Monocytes 10 Not Estab. %   Eos 1 Not Estab. %   Basos 1 Not Estab. %   Neutrophils Absolute 4.6 1.4 - 7.0 x10E3/uL   Lymphocytes Absolute 2.1 0.7 - 3.1 x10E3/uL   Monocytes Absolute 0.8 0.1 - 0.9 x10E3/uL   EOS (ABSOLUTE) 0.1 0.0 - 0.4 x10E3/uL   Basophils Absolute  0.1 0.0 - 0.2 x10E3/uL   Immature Granulocytes 0 Not Estab. %   Immature Grans (Abs) 0.0 0.0 - 0.1 x10E3/uL  POCT urinalysis dipstick     Status: Abnormal   Collection Time: 10/22/20  9:55 AM  Result Value Ref Range   Color, UA yellow    Clarity, UA clear  Glucose, UA Negative Negative   Bilirubin, UA negative    Ketones, UA negative    Spec Grav, UA <=1.005 (A) 1.010 - 1.025   Blood, UA negative    pH, UA 6.0 5.0 - 8.0   Protein, UA Negative Negative   Urobilinogen, UA 0.2 0.2 or 1.0 E.U./dL   Nitrite, UA negative    Leukocytes, UA Negative Negative   Appearance clear    Odor     Elevated creatinine GFR low-  Lipids remain elevated- improved from one year ago, similar to 2 years ago. Total Cholesterol and LDL elevated      Assessment & Plan:   Patient instructed to stop pre workout supplement. Will recheck CMP in one month to re address kidney function. Continue to stay hydrated especially with workouts.   Discussed starting Zetia as was recommended at her previous appointment. Will also re address this in one month after evaluating kidney function  Patient treated today for sinusitis. Advised using sunblock and avoiding direct sunlight while on doxycycline. Continue decongestant for the next 48 hours at least and maintain allergy regimen. If symptoms persist or worsen RTC for re evaluation as discussed.   BP elevated today- patient is also acutely ill will continue to monitor   Referred to Psychiatry for ADHD evaluation  Referred to [facility] for colonoscopy Patient has mammogram ordered through [facility] ordered this encounter  Medications  . amitriptyline (ELAVIL) 50 MG tablet    Sig: Take 1 tablet (50 mg total) by mouth at bedtime as needed for sleep.    Dispense:  30 tablet    Refill:  2  . doxycycline (VIBRA-TABS) 100 MG tablet    Sig: Take 1 tablet (100 mg total) by mouth 2 (two) times daily for 7 days. Take with food. Wear sun screen/avoid direct sunlight     Dispense:  14 tablet    Refill:  0

## 2020-10-29 NOTE — Patient Instructions (Signed)
Sinusitis, Adult Sinusitis is inflammation of your sinuses. Sinuses are hollow spaces in the bones around your face. Your sinuses are located:  Around your eyes.  In the middle of your forehead.  Behind your nose.  In your cheekbones. Mucus normally drains out of your sinuses. When your nasal tissues become inflamed or swollen, mucus can become trapped or blocked. This allows bacteria, viruses, and fungi to grow, which leads to infection. Most infections of the sinuses are caused by a virus. Sinusitis can develop quickly. It can last for up to 4 weeks (acute) or for more than 12 weeks (chronic). Sinusitis often develops after a cold. What are the causes? This condition is caused by anything that creates swelling in the sinuses or stops mucus from draining. This includes:  Allergies.  Asthma.  Infection from bacteria or viruses.  Deformities or blockages in your nose or sinuses.  Abnormal growths in the nose (nasal polyps).  Pollutants, such as chemicals or irritants in the air.  Infection from fungi (rare). What increases the risk? You are more likely to develop this condition if you:  Have a weak body defense system (immune system).  Do a lot of swimming or diving.  Overuse nasal sprays.  Smoke. What are the signs or symptoms? The main symptoms of this condition are pain and a feeling of pressure around the affected sinuses. Other symptoms include:  Stuffy nose or congestion.  Thick drainage from your nose.  Swelling and warmth over the affected sinuses.  Headache.  Upper toothache.  A cough that may get worse at night.  Extra mucus that collects in the throat or the back of the nose (postnasal drip).  Decreased sense of smell and taste.  Fatigue.  A fever.  Sore throat.  Bad breath. How is this diagnosed? This condition is diagnosed based on:  Your symptoms.  Your medical history.  A physical exam.  Tests to find out if your condition is  acute or chronic. This may include: ? Checking your nose for nasal polyps. ? Viewing your sinuses using a device that has a light (endoscope). ? Testing for allergies or bacteria. ? Imaging tests, such as an MRI or CT scan. In rare cases, a bone biopsy may be done to rule out more serious types of fungal sinus disease. How is this treated? Treatment for sinusitis depends on the cause and whether your condition is chronic or acute.  If caused by a virus, your symptoms should go away on their own within 10 days. You may be given medicines to relieve symptoms. They include: ? Medicines that shrink swollen nasal passages (topical intranasal decongestants). ? Medicines that treat allergies (antihistamines). ? A spray that eases inflammation of the nostrils (topical intranasal corticosteroids). ? Rinses that help get rid of thick mucus in your nose (nasal saline washes).  If caused by bacteria, your health care provider may recommend waiting to see if your symptoms improve. Most bacterial infections will get better without antibiotic medicine. You may be given antibiotics if you have: ? A severe infection. ? A weak immune system.  If caused by narrow nasal passages or nasal polyps, you may need to have surgery. Follow these instructions at home: Medicines  Take, use, or apply over-the-counter and prescription medicines only as told by your health care provider. These may include nasal sprays.  If you were prescribed an antibiotic medicine, take it as told by your health care provider. Do not stop taking the antibiotic even if you start   to feel better. Hydrate and humidify  Drink enough fluid to keep your urine pale yellow. Staying hydrated will help to thin your mucus.  Use a cool mist humidifier to keep the humidity level in your home above 50%.  Inhale steam for 10-15 minutes, 3-4 times a day, or as told by your health care provider. You can do this in the bathroom while a hot shower is  running.  Limit your exposure to cool or dry air.   Rest  Rest as much as possible.  Sleep with your head raised (elevated).  Make sure you get enough sleep each night. General instructions  Apply a warm, moist washcloth to your face 3-4 times a day or as told by your health care provider. This will help with discomfort.  Wash your hands often with soap and water to reduce your exposure to germs. If soap and water are not available, use hand sanitizer.  Do not smoke. Avoid being around people who are smoking (secondhand smoke).  Keep all follow-up visits as told by your health care provider. This is important.   Contact a health care provider if:  You have a fever.  Your symptoms get worse.  Your symptoms do not improve within 10 days. Get help right away if:  You have a severe headache.  You have persistent vomiting.  You have severe pain or swelling around your face or eyes.  You have vision problems.  You develop confusion.  Your neck is stiff.  You have trouble breathing. Summary  Sinusitis is soreness and inflammation of your sinuses. Sinuses are hollow spaces in the bones around your face.  This condition is caused by nasal tissues that become inflamed or swollen. The swelling traps or blocks the flow of mucus. This allows bacteria, viruses, and fungi to grow, which leads to infection.  If you were prescribed an antibiotic medicine, take it as told by your health care provider. Do not stop taking the antibiotic even if you start to feel better.  Keep all follow-up visits as told by your health care provider. This is important. This information is not intended to replace advice given to you by your health care provider. Make sure you discuss any questions you have with your health care provider. Document Revised: 12/26/2017 Document Reviewed: 12/26/2017 Elsevier Patient Education  2021 [employer].  

## 2020-11-27 ENCOUNTER — Other Ambulatory Visit: Payer: Self-pay

## 2020-11-27 DIAGNOSIS — R7989 Other specified abnormal findings of blood chemistry: Secondary | ICD-10-CM

## 2020-11-27 NOTE — Progress Notes (Signed)
Patient instructed to stop pre workout supplement. Will recheck CMP in one month to re address kidney function, per Kristian Covey.  Pt presented today to complete labs. CL,RMA

## 2020-11-28 LAB — COMPREHENSIVE METABOLIC PANEL
ALT: 19 IU/L (ref 0–32)
AST: 33 IU/L (ref 0–40)
Albumin/Globulin Ratio: 1.9 (ref 1.2–2.2)
Albumin: 4.9 g/dL (ref 3.8–4.9)
Alkaline Phosphatase: 99 IU/L (ref 44–121)
BUN/Creatinine Ratio: 18 (ref 9–23)
BUN: 18 mg/dL (ref 6–24)
Bilirubin Total: 0.4 mg/dL (ref 0.0–1.2)
CO2: 21 mmol/L (ref 20–29)
Calcium: 9.6 mg/dL (ref 8.7–10.2)
Chloride: 103 mmol/L (ref 96–106)
Creatinine, Ser: 0.99 mg/dL (ref 0.57–1.00)
Globulin, Total: 2.6 g/dL (ref 1.5–4.5)
Glucose: 70 mg/dL (ref 65–99)
Potassium: 4.5 mmol/L (ref 3.5–5.2)
Sodium: 146 mmol/L — ABNORMAL HIGH (ref 134–144)
Total Protein: 7.5 g/dL (ref 6.0–8.5)
eGFR: 69 mL/min/{1.73_m2} (ref >59)

## 2020-12-03 ENCOUNTER — Ambulatory Visit
Admission: RE | Admit: 2020-12-03 | Discharge: 2020-12-03 | Disposition: A | Discharge status: Home / Self Care | Payer: 59 | Source: Ambulatory Visit | Attending: Obstetrics and Gynecology | Admitting: Obstetrics and Gynecology

## 2020-12-03 ENCOUNTER — Other Ambulatory Visit: Payer: Self-pay

## 2020-12-03 DIAGNOSIS — Z1231 Encounter for screening mammogram for malignant neoplasm of breast: Secondary | ICD-10-CM | POA: Insufficient documentation

## 2020-12-04 ENCOUNTER — Encounter: Payer: Self-pay | Admitting: Nurse Practitioner

## 2020-12-04 ENCOUNTER — Ambulatory Visit: Payer: Self-pay | Admitting: Nurse Practitioner

## 2020-12-04 VITALS — BP 123/81 | HR 88 | Temp 98.1°F | Resp 14 | Ht 69.0 in | Wt 168.0 lb

## 2020-12-04 DIAGNOSIS — Z09 Encounter for follow-up examination after completed treatment for conditions other than malignant neoplasm: Secondary | ICD-10-CM

## 2020-12-04 DIAGNOSIS — G8929 Other chronic pain: Secondary | ICD-10-CM

## 2020-12-04 MED ORDER — CYCLOBENZAPRINE HCL 10 MG PO TABS: 10.0000 mg | ORAL_TABLET | Freq: Two times a day (BID) | ORAL | 0 refills | Status: DC | PRN

## 2020-12-04 NOTE — Patient Instructions (Signed)
Your lab results have returned to normal. Do not start back on the pre workout supplement. Return here as needed.

## 2020-12-04 NOTE — Addendum Note (Signed)
Addended by: Ashley Murrain on: 12/04/2020 05:33 PM   Modules accepted: Orders

## 2020-12-04 NOTE — Progress Notes (Addendum)
Jenna Holt is a.age female who return today to discuss lab results. On her previous visit it was noted that her renal functions were slightly elevated. She reported that she was doing protein shakes after work outs, eating protein bars and had added a pre work out supplement. Since the last visit, patient reports she stopped the pre workout supplement. Patient denies any problems.   BP 123/81   Pulse 88   Temp 98.1 F (36.7 C)   Resp 14   Ht 5\' 9"  (1.753 m)   Wt 168 lb (76.2 kg)   SpO2 98%   BMI 24.81 kg/m   CMET results for this visit are normal.   Discussed with the patient results and she will stay off the pre workout supplement. If she has any problems she will return to the clinic for follow up. Patient given opportunity to ask questions. All questions fully answered and patient voices understanding.   Patient also ask for refill on her flexeril.

## 2021-01-30 ENCOUNTER — Other Ambulatory Visit: Payer: Self-pay | Admitting: Physician Assistant

## 2021-01-30 DIAGNOSIS — E785 Hyperlipidemia, unspecified: Secondary | ICD-10-CM

## 2021-03-05 ENCOUNTER — Other Ambulatory Visit: Payer: Self-pay

## 2021-03-05 NOTE — Telephone Encounter (Signed)
When putting in Rx refill request for Crestor, a box opened up indicating there was already a refill request in Epic. Re-routed to Randel Pigg, PA-C.  AMD

## 2021-03-09 ENCOUNTER — Other Ambulatory Visit: Payer: Self-pay

## 2021-03-09 DIAGNOSIS — E785 Hyperlipidemia, unspecified: Secondary | ICD-10-CM

## 2021-03-09 MED ORDER — ROSUVASTATIN CALCIUM 20 MG PO TABS: 20.0000 mg | ORAL_TABLET | Freq: Every day | ORAL | 2 refills | Status: DC

## 2021-03-25 ENCOUNTER — Other Ambulatory Visit: Payer: Self-pay

## 2021-03-25 DIAGNOSIS — M25552 Pain in left hip: Secondary | ICD-10-CM

## 2021-03-25 DIAGNOSIS — G8929 Other chronic pain: Secondary | ICD-10-CM

## 2021-03-26 MED ORDER — CYCLOBENZAPRINE HCL 10 MG PO TABS: 10.0000 mg | ORAL_TABLET | Freq: Two times a day (BID) | ORAL | 0 refills | Status: DC | PRN

## 2021-04-14 ENCOUNTER — Other Ambulatory Visit: Payer: Self-pay

## 2021-04-14 ENCOUNTER — Encounter: Payer: Self-pay | Admitting: Physician Assistant

## 2021-04-14 ENCOUNTER — Ambulatory Visit: Payer: Self-pay | Admitting: Physician Assistant

## 2021-04-14 VITALS — BP 125/85 | HR 96 | Temp 97.9°F | Resp 14 | Ht 69.0 in | Wt 172.0 lb

## 2021-04-14 DIAGNOSIS — H6122 Impacted cerumen, left ear: Secondary | ICD-10-CM

## 2021-04-14 DIAGNOSIS — H6982 Other specified disorders of Eustachian tube, left ear: Secondary | ICD-10-CM

## 2021-04-14 MED ORDER — FEXOFENADINE-PSEUDOEPHED ER 60-120 MG PO TB12: 1.0000 | ORAL_TABLET | Freq: Two times a day (BID) | ORAL | 0 refills | Status: DC

## 2021-04-14 NOTE — Progress Notes (Signed)
   Subjective: Left ear pressure    Patient ID: Jenna Holt, female    DOB: [birth year], 50 y.o.   MRN: [id number]  HPI Patient presents with left ear pressure.  Patient states last week experiencing frontal headache, nausea, and loose stools.  Patient saw telehealth and was diagnosed with sinus infection.  Patient was given a prescription for Z-Pak.  Patient states continue to have left ear pressure.  Denies vertigo or vision disturbance.  Requests work note   Review of Systems ADD, hyperlipidemia, and hypertension.    Objective:   Physical Exam No acute distress.  Temperature 97.9, pulse 96, respiration 14, BP is 125/85, patient 99% O2 sat on room air.  Patient weighs 172 pounds and BMI is 25.4. HEENT was remarkable for cerumen impaction of the left ear.  Neck was supple without adenectomy or bruits.  Lungs are clear to auscultation.  Heart regular rate and rhythm.       Assessment & Plan: Cerumen impaction   Impression removed via irrigation.  Patient left TM edematous but not erythematous.  Patient given a prescription for Allegra-D and advised to follow-up in 3 to 5 days if no improvement.  Patient may return back to full duties.

## 2021-04-14 NOTE — Progress Notes (Signed)
Pt started having symptoms Tuesday, head ache, nausea, loose stools and as of today pressure in left ear. Work [employer] is requiring her a not to go back to work. Pt did receive telehealth appt through [employer].

## 2021-06-04 ENCOUNTER — Encounter: Payer: Self-pay | Admitting: *Deleted

## 2021-06-05 ENCOUNTER — Ambulatory Visit: Payer: 59 | Admitting: Anesthesiology

## 2021-06-05 ENCOUNTER — Ambulatory Visit
Admission: RE | Admit: 2021-06-05 | Discharge: 2021-06-05 | Disposition: A | Discharge status: Home / Self Care | Payer: 59 | Attending: Gastroenterology | Admitting: Gastroenterology

## 2021-06-05 ENCOUNTER — Encounter
Admission: RE | Disposition: A | Discharge status: Home / Self Care | Payer: Self-pay | Source: Home / Self Care | Attending: Gastroenterology

## 2021-06-05 DIAGNOSIS — Z1211 Encounter for screening for malignant neoplasm of colon: Secondary | ICD-10-CM | POA: Insufficient documentation

## 2021-06-05 DIAGNOSIS — D122 Benign neoplasm of ascending colon: Secondary | ICD-10-CM | POA: Diagnosis not present

## 2021-06-05 DIAGNOSIS — Z91040 Latex allergy status: Secondary | ICD-10-CM | POA: Diagnosis not present

## 2021-06-05 DIAGNOSIS — Z886 Allergy status to analgesic agent status: Secondary | ICD-10-CM | POA: Diagnosis not present

## 2021-06-05 DIAGNOSIS — Z880 Allergy status to penicillin: Secondary | ICD-10-CM | POA: Insufficient documentation

## 2021-06-05 DIAGNOSIS — Z79899 Other long term (current) drug therapy: Secondary | ICD-10-CM | POA: Insufficient documentation

## 2021-06-05 HISTORY — PX: COLONOSCOPY WITH PROPOFOL: SHX5780

## 2021-06-05 LAB — POCT PREGNANCY, URINE: Preg Test, Ur: NEGATIVE

## 2021-06-05 SURGERY — COLONOSCOPY WITH PROPOFOL
Anesthesia: General

## 2021-06-05 MED ORDER — LIDOCAINE HCL (CARDIAC) PF 100 MG/5ML IV SOSY
PREFILLED_SYRINGE | INTRAVENOUS | Status: DC | PRN
  Administered 2021-06-05: 20 mg via INTRAVENOUS

## 2021-06-05 MED ORDER — SODIUM CHLORIDE 0.9 % IV SOLN
INTRAVENOUS | Status: DC
  Administered 2021-06-05: 20 mL/h via INTRAVENOUS

## 2021-06-05 MED ORDER — PROPOFOL 10 MG/ML IV BOLUS
INTRAVENOUS | Status: DC | PRN
  Administered 2021-06-05 (×5): 20 mg via INTRAVENOUS

## 2021-06-05 MED ORDER — PHENYLEPHRINE HCL (PRESSORS) 10 MG/ML IV SOLN
INTRAVENOUS | Status: AC
  Filled 2021-06-05: qty 1

## 2021-06-05 MED ORDER — PROPOFOL 10 MG/ML IV BOLUS
INTRAVENOUS | Status: AC
  Filled 2021-06-05: qty 20

## 2021-06-05 MED ORDER — PROPOFOL 500 MG/50ML IV EMUL
INTRAVENOUS | Status: AC
  Filled 2021-06-05: qty 50

## 2021-06-05 MED ORDER — PROPOFOL 500 MG/50ML IV EMUL
INTRAVENOUS | Status: DC | PRN
  Administered 2021-06-05: 100 ug/kg/min via INTRAVENOUS

## 2021-06-05 NOTE — H&P (Signed)
Outpatient short stay form Pre-procedure 06/05/2021  Jenna Rubenstein, MD  Primary Physician: Malachy Mood, MD  Reason for visit:  Screening  History of present illness:   50 y/o lady with history of HLD here for screening colonoscopy. No family history of GI malignancies. No blood thinners. No abdominal surgeries. No new symptoms.    Current Facility-Administered Medications:    0.9 %  sodium chloride infusion, , Intravenous, Continuous, Labresha Mellor, Hilton Cork, MD, Last Rate: 20 mL/hr at 06/05/21 0657, 20 mL/hr at 06/05/21 0657  Medications Prior to Admission  Medication Sig Dispense Refill Last Dose   Crisaborole (EUCRISA) 2 % OINT Apply 1 application topically as directed. Qd to bid aa rash on eyelids, face, hands 100 g 2 Past Week   cyclobenzaprine (FLEXERIL) 10 MG tablet Take 1 tablet (10 mg total) by mouth 2 (two) times daily as needed for muscle spasms. 30 tablet 0 Past Week   fexofenadine-pseudoephedrine (ALLEGRA-D) 60-120 MG 12 hr tablet Take 1 tablet by mouth 2 (two) times daily. 20 tablet 0 Past Week   mometasone (ELOCON) 0.1 % lotion    Past Week   rosuvastatin (CRESTOR) 20 MG tablet Take 1 tablet (20 mg total) by mouth daily. 90 tablet 2 Past Week   acyclovir ointment (ZOVIRAX) 5 % Apply 1 application topically every 3 (three) hours. 30 g 2    amitriptyline (ELAVIL) 50 MG tablet Take 1 tablet (50 mg total) by mouth at bedtime as needed for sleep. 30 tablet 2    ciclopirox (LOPROX) 0.77 % cream Apply topically 2 (two) times daily. Bid to feet and toes 90 g 2      Allergies  Allergen Reactions   Latex Dermatitis    If worn   Penicillins Hives   Naproxen Hives, Itching and Rash     Past Medical History:  Diagnosis Date   High cholesterol     Review of systems:  Otherwise negative.    Physical Exam  Gen: Alert, oriented. Appears stated age.  HEENT: PERRLA. Lungs: No respiratory distress CV: RRR Abd: soft, benign, no masses Ext: No edema    Planned  procedures: Proceed with colonoscopy. The patient understands the nature of the planned procedure, indications, risks, alternatives and potential complications including but not limited to bleeding, infection, perforation, damage to internal organs and possible oversedation/side effects from anesthesia. The patient agrees and gives consent to proceed.  Please refer to procedure notes for findings, recommendations and patient disposition/instructions.     Jenna Rubenstein, MD [facility]

## 2021-06-05 NOTE — Op Note (Signed)
[facility] Patient Name: Jenna Holt Procedure Date: 06/05/2021 7:11 AM MRN: [id number] Account #: [id number] Date of Birth: [birth year] Admit Type: Outpatient Age: 50 Room: [facility] ENDO ROOM 3 Gender: Female Note Status: Finalized Instrument Name: Jasper Riling [id number] Procedure:             Colonoscopy Indications:           Screening for colorectal malignant neoplasm Providers:             Andrey Farmer MD, MD Referring MD:          Stoney Bang. Georgianne Fick (Referring MD) Medicines:             Monitored Anesthesia Care Complications:         No immediate complications. Estimated blood loss:                         Minimal. Procedure:             Pre-Anesthesia Assessment:                        - Prior to the procedure, a History and Physical was                         performed, and patient medications and allergies were                         reviewed. The patient is competent. The risks and                         benefits of the procedure and the sedation options and                         risks were discussed with the patient. All questions                         were answered and informed consent was obtained.                         Patient identification and proposed procedure were                         verified by the physician, the nurse, the anesthetist                         and the technician in the endoscopy suite. Mental                         Status Examination: alert and oriented. Airway                         Examination: normal oropharyngeal airway and neck                         mobility. Respiratory Examination: clear to                         auscultation. CV Examination: normal. Prophylactic  Antibiotics: The patient does not require prophylactic                         antibiotics. Prior Anticoagulants: The patient has                         taken no previous anticoagulant or  antiplatelet                         agents. ASA Grade Assessment: II - A patient with mild                         systemic disease. After reviewing the risks and                         benefits, the patient was deemed in satisfactory                         condition to undergo the procedure. The anesthesia                         plan was to use monitored anesthesia care (MAC).                         Immediately prior to administration of medications,                         the patient was re-assessed for adequacy to receive                         sedatives. The heart rate, respiratory rate, oxygen                         saturations, blood pressure, adequacy of pulmonary                         ventilation, and response to care were monitored                         throughout the procedure. The physical status of the                         patient was re-assessed after the procedure.                        After obtaining informed consent, the colonoscope was                         passed under direct vision. Throughout the procedure,                         the patient's blood pressure, pulse, and oxygen                         saturations were monitored continuously. The                         Colonoscope was introduced through the anus and  advanced to the the cecum, identified by appendiceal                         orifice and ileocecal valve. The colonoscopy was                         performed without difficulty. The patient tolerated                         the procedure well. The quality of the bowel                         preparation was good. Findings:      The perianal and digital rectal examinations were normal.      A 8 mm polyp was found in the ascending colon. The polyp was flat. The       polyp was removed with a cold snare. Resection and retrieval were       complete. Estimated blood loss was minimal.      A single small-mouthed  diverticulum was found in the sigmoid colon.      Internal hemorrhoids were found during retroflexion. The hemorrhoids       were Grade I (internal hemorrhoids that do not prolapse).      The exam was otherwise without abnormality on direct and retroflexion       views. Impression:            - One 8 mm polyp in the ascending colon, removed with                         a cold snare. Resected and retrieved.                        - Diverticulosis in the sigmoid colon.                        - Internal hemorrhoids.                        - The examination was otherwise normal on direct and                         retroflexion views. Recommendation:        - Discharge patient to home.                        - Resume previous diet.                        - Continue present medications.                        - Await pathology results.                        - Repeat colonoscopy for surveillance based on                         pathology results.                        - Return to referring  physician as previously                         scheduled. Procedure Code(s):     --- Professional ---                        [phone], Colonoscopy, flexible; with removal of                         tumor(s), polyp(s), or other lesion(s) by snare                         technique Diagnosis Code(s):     --- Professional ---                        Z12.11, Encounter for screening for malignant neoplasm                         of colon                        K63.5, Polyp of colon                        K64.0, First degree hemorrhoids                        K57.30, Diverticulosis of large intestine without                         perforation or abscess without bleeding CPT copyright 2019 American Medical Association. All rights reserved. The codes documented in this report are preliminary and upon coder review may  be revised to meet current compliance requirements. Andrey Farmer MD, MD 06/05/2021 8:14:59  AM Number of Addenda: 0 Note Initiated On: 06/05/2021 7:11 AM Scope Withdrawal Time: 0 hours 14 minutes 52 seconds  Total Procedure Duration: 0 hours 23 minutes 29 seconds  Estimated Blood Loss:  Estimated blood loss was minimal.      [facility]

## 2021-06-05 NOTE — Interval H&P Note (Signed)
History and Physical Interval Note:  06/05/2021 7:39 AM  Jenna Holt  has presented today for surgery, with the diagnosis of COLON CANCER SCREENING.  The various methods of treatment have been discussed with the patient and family. After consideration of risks, benefits and other options for treatment, the patient has consented to  Procedure(s): COLONOSCOPY WITH PROPOFOL (N/A) as a surgical intervention.  The patient's history has been reviewed, patient examined, no change in status, stable for surgery.  I have reviewed the patient's chart and labs.  Questions were answered to the patient's satisfaction.     Lesly Rubenstein  Ok to proceed with colonoscopy

## 2021-06-05 NOTE — Anesthesia Preprocedure Evaluation (Signed)
Anesthesia Evaluation  Patient identified by MRN, date of birth, ID band Patient awake    Reviewed: Allergy & Precautions, NPO status , Patient's Chart, lab work & pertinent test results  History of Anesthesia Complications Negative for: history of anesthetic complications  Airway Mallampati: I   Neck ROM: Full    Dental no notable dental hx.    Pulmonary neg pulmonary ROS,    Pulmonary exam normal breath sounds clear to auscultation       Cardiovascular Normal cardiovascular exam Rhythm:Regular Rate:Normal  ECG 10/22/20: normal   Neuro/Psych PSYCHIATRIC DISORDERS (ADD) negative neurological ROS     GI/Hepatic negative GI ROS,   Endo/Other  negative endocrine ROS  Renal/GU negative Renal ROS     Musculoskeletal   Abdominal   Peds  Hematology negative hematology ROS (+)   Anesthesia Other Findings   Reproductive/Obstetrics                             Anesthesia Physical Anesthesia Plan  ASA: 1  Anesthesia Plan: General   Post-op Pain Management:    Induction: Intravenous  PONV Risk Score and Plan: 3 and Propofol infusion, TIVA and Treatment may vary due to age or medical condition  Airway Management Planned: Natural Airway  Additional Equipment:   Intra-op Plan:   Post-operative Plan:   Informed Consent: I have reviewed the patients History and Physical, chart, labs and discussed the procedure including the risks, benefits and alternatives for the proposed anesthesia with the patient or authorized representative who has indicated his/her understanding and acceptance.       Plan Discussed with: CRNA  Anesthesia Plan Comments:         Anesthesia Quick Evaluation

## 2021-06-05 NOTE — Anesthesia Postprocedure Evaluation (Signed)
Anesthesia Post Note  Patient: Carrigan Delafuente [facility] [place]  Procedure(s) Performed: COLONOSCOPY WITH PROPOFOL  Patient location during evaluation: PACU Anesthesia Type: General Level of consciousness: awake and alert, oriented and patient cooperative Pain management: pain level controlled Vital Signs Assessment: post-procedure vital signs reviewed and stable Respiratory status: spontaneous breathing, nonlabored ventilation and respiratory function stable Cardiovascular status: blood pressure returned to baseline and stable Postop Assessment: adequate PO intake Anesthetic complications: no   No notable events documented.   Last Vitals:  Vitals:   06/05/21 0834 06/05/21 0844  BP: (!) 124/93 (!) 137/107  Pulse: 64 (!) 57  Resp: 15 15  Temp:    SpO2: 100% 100%    Last Pain:  Vitals:   06/05/21 0844  TempSrc:   PainSc: 0-No pain                 Darrin Nipper

## 2021-06-05 NOTE — Transfer of Care (Signed)
Immediate Anesthesia Transfer of Care Note  Patient: Jenna Holt [facility]  Procedure(s) Performed: COLONOSCOPY WITH PROPOFOL  Patient Location: PACU  Anesthesia Type:General  Level of Consciousness: awake, alert  and oriented  Airway & Oxygen Therapy: Patient Spontanous Breathing  Post-op Assessment: Report given to RN  Post vital signs: Reviewed and stable  Last Vitals:  Vitals Value Taken Time  BP 129/84 06/05/21 0817  Temp 35.9 C 06/05/21 0814  Pulse 71 06/05/21 0817  Resp 26 06/05/21 0817  SpO2 100 % 06/05/21 0817    Last Pain:  Vitals:   06/05/21 0814  TempSrc: Tympanic  PainSc: Asleep         Complications: No notable events documented.

## 2021-06-08 ENCOUNTER — Encounter: Payer: Self-pay | Admitting: Gastroenterology

## 2021-06-08 ENCOUNTER — Telehealth: Payer: Self-pay | Admitting: Physician Assistant

## 2021-06-08 LAB — SURGICAL PATHOLOGY: SURGICAL PATHOLOGY: NEGATIVE

## 2021-06-08 NOTE — Telephone Encounter (Signed)
   Patient wanted Ron to look at a cut she sustained on Sat. Was told to seek care if wound become hot to the touch or red and swollen.

## 2021-06-23 ENCOUNTER — Other Ambulatory Visit: Payer: Self-pay

## 2021-06-23 DIAGNOSIS — M25552 Pain in left hip: Secondary | ICD-10-CM

## 2021-06-23 DIAGNOSIS — G8929 Other chronic pain: Secondary | ICD-10-CM

## 2021-06-23 MED ORDER — CYCLOBENZAPRINE HCL 10 MG PO TABS: 10.0000 mg | ORAL_TABLET | Freq: Two times a day (BID) | ORAL | 0 refills | Status: DC | PRN

## 2021-08-17 NOTE — Progress Notes (Signed)
08/27/21 annual physical scheduled.

## 2021-08-18 ENCOUNTER — Other Ambulatory Visit: Payer: Self-pay

## 2021-08-18 ENCOUNTER — Ambulatory Visit: Payer: Self-pay

## 2021-08-18 DIAGNOSIS — Z0000 Encounter for general adult medical examination without abnormal findings: Secondary | ICD-10-CM

## 2021-08-18 LAB — POCT URINALYSIS DIPSTICK
Bilirubin, UA: NEGATIVE
Blood, UA: NEGATIVE
Glucose, UA: NEGATIVE
Ketones, UA: NEGATIVE
Nitrite, UA: NEGATIVE
Protein, UA: NEGATIVE
Spec Grav, UA: 1.01 (ref 1.010–1.025)
Urobilinogen, UA: 0.2 E.U./dL
pH, UA: 6 (ref 5.0–8.0)

## 2021-08-19 LAB — CMP12+LP+TP+TSH+6AC+CBC/D/PLT
ALT: 23 IU/L (ref 0–32)
AST: 44 IU/L — ABNORMAL HIGH (ref 0–40)
Albumin/Globulin Ratio: 2 (ref 1.2–2.2)
Albumin: 5 g/dL — ABNORMAL HIGH (ref 3.8–4.9)
Alkaline Phosphatase: 95 IU/L (ref 44–121)
BUN/Creatinine Ratio: 13 (ref 9–23)
BUN: 11 mg/dL (ref 6–24)
Basophils Absolute: 0 10*3/uL (ref 0.0–0.2)
Basos: 1 %
Bilirubin Total: 0.4 mg/dL (ref 0.0–1.2)
Calcium: 10 mg/dL (ref 8.7–10.2)
Chloride: 101 mmol/L (ref 96–106)
Chol/HDL Ratio: 3.6 ratio (ref 0.0–4.4)
Cholesterol, Total: 284 mg/dL — ABNORMAL HIGH (ref 100–199)
Creatinine, Ser: 0.87 mg/dL (ref 0.57–1.00)
EOS (ABSOLUTE): 0.1 10*3/uL (ref 0.0–0.4)
Eos: 2 %
Estimated CHD Risk: 0.7 times avg. (ref 0.0–1.0)
Free Thyroxine Index: 2.4 (ref 1.2–4.9)
GGT: 14 IU/L (ref 0–60)
Globulin, Total: 2.5 g/dL (ref 1.5–4.5)
Glucose: 87 mg/dL (ref 70–99)
HDL: 78 mg/dL (ref >39)
Hematocrit: 42.1 % (ref 34.0–46.6)
Hemoglobin: 14 g/dL (ref 11.1–15.9)
Immature Grans (Abs): 0 10*3/uL (ref 0.0–0.1)
Immature Granulocytes: 0 %
Iron: 96 ug/dL (ref 27–159)
LDH: 252 IU/L — ABNORMAL HIGH (ref 119–226)
LDL Chol Calc (NIH): 184 mg/dL — ABNORMAL HIGH (ref 0–99)
Lymphocytes Absolute: 2.3 10*3/uL (ref 0.7–3.1)
Lymphs: 40 %
MCH: 29.4 pg (ref 26.6–33.0)
MCHC: 33.3 g/dL (ref 31.5–35.7)
MCV: 88 fL (ref 79–97)
Monocytes Absolute: 0.7 10*3/uL (ref 0.1–0.9)
Monocytes: 12 %
Neutrophils Absolute: 2.6 10*3/uL (ref 1.4–7.0)
Neutrophils: 45 %
Phosphorus: 4.8 mg/dL — ABNORMAL HIGH (ref 3.0–4.3)
Platelets: 290 10*3/uL (ref 150–450)
Potassium: 4.4 mmol/L (ref 3.5–5.2)
RBC: 4.76 x10E6/uL (ref 3.77–5.28)
RDW: 13 % (ref 11.7–15.4)
Sodium: 142 mmol/L (ref 134–144)
T3 Uptake Ratio: 32 % (ref 24–39)
T4, Total: 7.4 ug/dL (ref 4.5–12.0)
TSH: 1.9 u[IU]/mL (ref 0.450–4.500)
Total Protein: 7.5 g/dL (ref 6.0–8.5)
Triglycerides: 127 mg/dL (ref 0–149)
Uric Acid: 3.7 mg/dL (ref 3.0–7.2)
VLDL Cholesterol Cal: 22 mg/dL (ref 5–40)
WBC: 5.8 10*3/uL (ref 3.4–10.8)
eGFR: 80 mL/min/{1.73_m2} (ref >59)

## 2021-08-20 ENCOUNTER — Encounter: Payer: Self-pay | Admitting: Physician Assistant

## 2021-08-27 ENCOUNTER — Ambulatory Visit: Payer: Self-pay | Admitting: Physician Assistant

## 2021-08-27 ENCOUNTER — Encounter: Payer: Self-pay | Admitting: Physician Assistant

## 2021-08-27 ENCOUNTER — Other Ambulatory Visit: Payer: Self-pay

## 2021-08-27 DIAGNOSIS — G8929 Other chronic pain: Secondary | ICD-10-CM

## 2021-08-27 DIAGNOSIS — M25552 Pain in left hip: Secondary | ICD-10-CM

## 2021-08-27 MED ORDER — SIMVASTATIN 40 MG PO TABS: 40.0000 mg | ORAL_TABLET | Freq: Every day | ORAL | 3 refills | Status: DC

## 2021-08-27 MED ORDER — CRISABOROLE 2 % EX OINT: 1.0000 "application " | TOPICAL_OINTMENT | Freq: Two times a day (BID) | CUTANEOUS | 3 refills | Status: DC

## 2021-08-27 MED ORDER — CLOTRIMAZOLE-BETAMETHASONE 1-0.05 % EX CREA: 1.0000 "application " | TOPICAL_CREAM | Freq: Every day | CUTANEOUS | 0 refills | Status: DC

## 2021-08-27 MED ORDER — CYCLOBENZAPRINE HCL 10 MG PO TABS: 10.0000 mg | ORAL_TABLET | Freq: Two times a day (BID) | ORAL | 0 refills | Status: DC | PRN

## 2021-08-27 NOTE — Progress Notes (Signed)
[place]  ____________________________________________   None    (approximate)  I have reviewed the triage vital signs and the nursing notes.   HISTORY  Chief Complaint Annual Exam    HPI Jenna Holt is a 51 y.o. female patient presents for annual physical exam.  Patient was concerned for facial hand rash.         Past Medical History:  Diagnosis Date   High cholesterol     Patient Active Problem List   Diagnosis Date Noted   ADD (attention deficit disorder) 05/15/2019   Allergy 05/15/2019   Hyperlipidemia 05/15/2019   Elevated BP without diagnosis of hypertension 04/12/2019   Colon cancer screening 01/03/2019   Slow transit constipation 01/03/2019   Chronic left hip pain 08/23/2017   Insomnia 11/05/2013    Past Surgical History:  Procedure Laterality Date   AUGMENTATION MAMMAPLASTY Bilateral    2013   COLONOSCOPY WITH PROPOFOL N/A 06/05/2021   Procedure: COLONOSCOPY WITH PROPOFOL;  Surgeon: Lesly Rubenstein, MD;  Location: [facility] ENDOSCOPY;  Service: Endoscopy;  Laterality: N/A;   HIP SURGERY Left 06/2019   SHOULDER ARTHROSCOPY      Prior to Admission medications   Medication Sig Start Date End Date Taking? Authorizing Provider  acyclovir ointment (ZOVIRAX) 5 % Apply 1 application topically every 3 (three) hours. 08/15/20   Sable Feil, PA-C  amitriptyline (ELAVIL) 50 MG tablet Take 1 tablet (50 mg total) by mouth at bedtime as needed for sleep. 10/28/20 11/27/20  Apolonio Schneiders, FNP  ciclopirox (LOPROX) 0.77 % cream Apply topically 2 (two) times daily. Bid to feet and toes 05/12/20   [name], MD  Crisaborole (EUCRISA) 2 % OINT Apply 1 application topically as directed. Qd to bid aa rash on eyelids, face, hands 05/12/20   [name], MD  cyclobenzaprine (FLEXERIL) 10 MG tablet Take 1 tablet (10 mg total) by mouth 2 (two) times daily as needed for muscle spasms. 06/23/21   Sable Feil, PA-C   fexofenadine-pseudoephedrine (ALLEGRA-D) 60-120 MG 12 hr tablet Take 1 tablet by mouth 2 (two) times daily. 04/14/21   Sable Feil, PA-C  mometasone (ELOCON) 0.1 % lotion  03/26/20   [provider]  rosuvastatin (CRESTOR) 20 MG tablet Take 1 tablet (20 mg total) by mouth daily. 03/09/21   Sable Feil, PA-C    Allergies Latex, Penicillins, and Naproxen  Family History  Problem Relation Age of Onset   Breast cancer Maternal Grandmother        great grandmother   Breast cancer Paternal Uncle        69's   Heart attack Mother    Lung cancer Father        smoker    Social History Social History   Tobacco Use   Smoking status: Never   Smokeless tobacco: Never  Vaping Use   Vaping Use: Never used  Substance Use Topics   Alcohol use: Yes    Comment: occ   Drug use: No    Review of Systems Constitutional: No fever/chills Eyes: No visual changes. ENT: No sore throat. Cardiovascular: Denies chest pain. Respiratory: Denies shortness of breath. Gastrointestinal: No abdominal pain.  No nausea, no vomiting.  No diarrhea.  No constipation. Genitourinary: Negative for dysuria. Musculoskeletal: Negative for back pain. Skin: Atopic dermatitis  neurological: Negative for headaches, focal weakness or numbness. Psychiatric: ADD Endocrine: Hyperlipidemia Allergic/Immunilogical: Latex, naproxen, penicillin.  ____________________________________________   PHYSICAL EXAM:  VITAL SIGNS: Temperature is 97.5, pulse  85, respiration 12, BP is 114/70, patient 1% O2 sat on room air.  Patient weighs 179 pounds and BMI is 26.82. Constitutional: Alert and oriented. Well appearing and in no acute distress. Eyes: Conjunctivae are normal. PERRL. EOMI. Head: Atraumatic. Nose: No congestion/rhinnorhea. Mouth/Throat: Mucous membranes are moist.  Oropharynx non-erythematous. Neck: No stridor.  No cervical spine tenderness to palpation. Hematological/Lymphatic/Immunilogical: No cervical  lymphadenopathy. Cardiovascular: Normal rate, regular rhythm. Grossly normal heart sounds.  Good peripheral circulation. Respiratory: Normal respiratory effort.  No retractions. Lungs CTAB. Gastrointestinal: Soft and nontender. No distention. No abdominal bruits. No CVA tenderness. Genitourinary: Deferred Musculoskeletal: No lower extremity tenderness nor edema.  No joint effusions. Neurologic:  Normal speech and language. No gross focal neurologic deficits are appreciated. No gait instability. Skin:  Skin is warm, dry and intact.  Erythematous macular lesions facial area and hands.   Psychiatric: Mood and affect are normal. Speech and behavior are normal.  ____________________________________________   LABS        Component Ref Range & Units 9 d ago 10 mo ago 1 yr ago 2 yr ago  Color, UA  Light Yellow  yellow  yellow  Yellow   Clarity, UA  Clear  clear  clear  Clear   Glucose, UA Negative Negative  Negative  Negative  Negative   Bilirubin, UA  Negative  negative  negative  Negative   Ketones, UA  Negative  negative  negative  Negative   Spec Grav, UA 1.010 - 1.025 1.010  <=1.005 Abnormal   1.015  1.020   Blood, UA  Negative  negative  negative  Negative   pH, UA 5.0 - 8.0 6.0  6.0  6.0  6.0   Protein, UA Negative Negative  Negative  Negative  Negative   Urobilinogen, UA 0.2 or 1.0 E.U./dL 0.2  0.2  0.2  0.2   Nitrite, UA  Negative  negative  negative  Negative   Leukocytes, UA Negative Small (1+) Abnormal   Negative  Negative  Negative   Appearance   clear       Odor                 Specimen Collected: 08/18/21 09:05 Last Resulted: 08/18/21 09:05      Lab Flowsheet    Order Details    View Encounter    Lab and Collection Details    Routing    Result History    View Encounter Conversation        Result Care Coordination   Patient Communication   Add Comments   Seen Back to Top       Other Results from 08/18/2021   Contains abnormal data  CMP12+LP+TP+TSH+6AC+CBC/D/Plt Order: [id number] Status: Final result    Visible to patient: Yes (seen)    Next appt: None    Dx: Routine adult health maintenance    0 Result Notes         Component Ref Range & Units 9 d ago 9 mo ago 10 mo ago 1 yr ago 2 yr ago  Glucose 70 - 99 mg/dL 87  70 R  88 R  93 R  92 R   Uric Acid 3.0 - 7.2 mg/dL 3.7   4.4 CM  4.2 CM  4.9 R, CM   Comment:            Therapeutic target for gout patients: <6.0  BUN 6 - 24 mg/dL 11  18  20  10  15    Creatinine,  Ser 0.57 - 1.00 mg/dL 0.87  0.99  1.13 High   0.81  0.87   eGFR >59 mL/min/1.73 80  69  59 Low      BUN/Creatinine Ratio 9 - 23 13  18  18  12  17    Sodium 134 - 144 mmol/L 142  146 High   140  141  139   Potassium 3.5 - 5.2 mmol/L 4.4  4.5  4.3  4.4  4.1   Chloride 96 - 106 mmol/L 101  103  98  103  102   Calcium 8.7 - 10.2 mg/dL 10.0  9.6  9.5  9.4  9.6   Phosphorus 3.0 - 4.3 mg/dL 4.8 High    4.9 High   3.5  3.1   Total Protein 6.0 - 8.5 g/dL 7.5  7.5  7.2  7.2  7.2   Albumin 3.8 - 4.9 g/dL 5.0 High   4.9  4.8  4.8  4.7 R   Globulin, Total 1.5 - 4.5 g/dL 2.5  2.6  2.4  2.4  2.5   Albumin/Globulin Ratio 1.2 - 2.2 2.0  1.9  2.0  2.0  1.9   Bilirubin Total 0.0 - 1.2 mg/dL 0.4  0.4  0.4  0.3  0.5   Alkaline Phosphatase 44 - 121 IU/L 95  99  86  76 R  77 R   LDH 119 - 226 IU/L 252 High    265 High   290 High   236 High    AST 0 - 40 IU/L 44 High   33  34  46 High   32   ALT 0 - 32 IU/L 23  19  17  26  17    GGT 0 - 60 IU/L 14   10  13  10    Iron 27 - 159 ug/dL 96   109  131  136   Cholesterol, Total 100 - 199 mg/dL 284 High    236 High   243 High   221 High    Triglycerides 0 - 149 mg/dL 127   65  76  82   HDL >39 mg/dL 78   71  70  63   VLDL Cholesterol Cal 5 - 40 mg/dL 22   11  13  14    LDL Chol Calc (NIH) 0 - 99 mg/dL 184 High    154 High   160 High   144 High    Chol/HDL Ratio 0.0 - 4.4 ratio 3.6   3.3 CM  3.5 CM  3.5 CM   Comment:                                   T. Chol/HDL Ratio                                               Men  Women                                1/2 Avg.Risk  3.4    3.3  Avg.Risk  5.0    4.4                                 2X Avg.Risk  9.6    7.1                                 3X Avg.Risk 23.4   11.0   Estimated CHD Risk 0.0 - 1.0 times avg. 0.7    < 0.5 CM  0.6 CM  0.6 CM   Comment: The CHD Risk is based on the T. Chol/HDL ratio. Other  factors affect CHD Risk such as hypertension, smoking,  diabetes, severe obesity, and family history of  premature CHD.   TSH 0.450 - 4.500 uIU/mL 1.900   1.540  1.350  1.300   T4, Total 4.5 - 12.0 ug/dL 7.4   8.1  6.1  8.0   T3 Uptake Ratio 24 - 39 % 32   30  27  30    Free Thyroxine Index 1.2 - 4.9 2.4   2.4  1.6  2.4   WBC 3.4 - 10.8 x10E3/uL 5.8   7.6  5.6  6.2   RBC 3.77 - 5.28 x10E6/uL 4.76   4.65  4.55  4.56   Hemoglobin 11.1 - 15.9 g/dL 14.0   13.9  13.5  13.6   Hematocrit 34.0 - 46.6 % 42.1   41.5  40.7  40.9   MCV 79 - 97 fL 88   89  90  90   MCH 26.6 - 33.0 pg 29.4   29.9  29.7  29.8   MCHC 31.5 - 35.7 g/dL 33.3   33.5  33.2  33.3   RDW 11.7 - 15.4 % 13.0   12.8  12.8  12.2   Platelets 150 - 450 x10E3/uL 290   282  278  240   Neutrophils Not Estab. % 45   60  48  52   Lymphs Not Estab. % 40   28  37  35   Monocytes Not Estab. % 12   10  11  11    Eos Not Estab. % 2   1  3  1    Basos Not Estab. % 1   1  1  1    Neutrophils Absolute 1.4 - 7.0 x10E3/uL 2.6   4.6  2.8  3.3   Lymphocytes Absolute 0.7 - 3.1 x10E3/uL 2.3   2.1  2.0  2.1   Monocytes Absolute 0.1 - 0.9 x10E3/uL 0.7   0.8  0.6  0.7   EOS (ABSOLUTE) 0.0 - 0.4 x10E3/uL 0.1   0.1  0.1  0.1   Basophils Absolute 0.0 - 0.2 x10E3/uL 0.0   0.1  0.0  0.0   Immature Granulocytes Not Estab. % 0   0  0  0               ___________________________________________  ____________________________________________  ____________________________________________      ____________________________________________   INITIAL  IMPRESSION / ASSESSMENT AND PLAN / ED COURSE  As part of my medical decision making, I reviewed the following data within the electronic MEDICAL RECORD NUMBER   Well exam.  Discussed patient elevated cholesterol and discussed rationale for increasing doses of simvastatin from 20 mg daily to 40 mg daily.  Patient also given refill of medication for atopic dermatitis and advised we  will consider consult dermatology if no improvement in 2 to 4 weeks.  Patient advised to follow-up in 3 months for fasting lipids.           ____________________________________________   FINAL CLINICAL IMPRESSION Well exam.  ED Discharge Orders     None        Note:  This document was prepared using Dragon voice recognition software and may include unintentional dictation errors.

## 2021-08-27 NOTE — Progress Notes (Signed)
Requesting Rx refills for Rosuvastatin 20 mg, Cyclobenzaprine 10 mg & Eucrisa 2% ointment

## 2021-09-21 ENCOUNTER — Other Ambulatory Visit: Payer: Self-pay

## 2021-09-21 DIAGNOSIS — G4700 Insomnia, unspecified: Secondary | ICD-10-CM

## 2021-09-21 MED ORDER — AMITRIPTYLINE HCL 50 MG PO TABS: 50.0000 mg | ORAL_TABLET | Freq: Every evening | ORAL | 2 refills | Status: DC | PRN

## 2021-10-14 ENCOUNTER — Other Ambulatory Visit: Payer: Self-pay | Admitting: Physician Assistant

## 2021-10-14 DIAGNOSIS — G4700 Insomnia, unspecified: Secondary | ICD-10-CM

## 2021-10-15 ENCOUNTER — Other Ambulatory Visit: Payer: Self-pay

## 2021-10-15 DIAGNOSIS — M25552 Pain in left hip: Secondary | ICD-10-CM

## 2021-10-15 MED ORDER — CYCLOBENZAPRINE HCL 10 MG PO TABS: 10.0000 mg | ORAL_TABLET | Freq: Two times a day (BID) | ORAL | 0 refills | Status: DC | PRN

## 2021-10-16 ENCOUNTER — Telehealth: Payer: Self-pay

## 2021-10-16 DIAGNOSIS — Z1239 Encounter for other screening for malignant neoplasm of breast: Secondary | ICD-10-CM

## 2021-10-16 NOTE — Telephone Encounter (Signed)
From: [name] [email or web address]> ?Sent: Friday, October 16, 2021 8:51 AM ?To: Tami Lin '@[email or web address]> ?Subject: Mammogram  ?  ?Good Morning, ? ?I am trying to get a mammogram scheduled but I am sure that I will need a referral before I can do that.  Can I put in a request for one? ? ? ? ?Jenna Holt ?Best boy, Records Division ?[place] Department ?Phone: [phone] ?[email or web address] ? ?Order for MM 3D Screen Breast W/Implant Bilateral put in Epic. ? ?AMD ? ?

## 2021-10-26 ENCOUNTER — Other Ambulatory Visit: Payer: Self-pay

## 2021-10-26 DIAGNOSIS — Z8619 Personal history of other infectious and parasitic diseases: Secondary | ICD-10-CM

## 2021-10-26 MED ORDER — ACYCLOVIR 5 % EX OINT: 1.0000 "application " | TOPICAL_OINTMENT | CUTANEOUS | 2 refills | Status: AC

## 2021-11-12 ENCOUNTER — Ambulatory Visit: Payer: Self-pay | Admitting: Physician Assistant

## 2021-11-12 ENCOUNTER — Other Ambulatory Visit: Payer: Self-pay

## 2021-11-12 ENCOUNTER — Encounter: Payer: Self-pay | Admitting: Physician Assistant

## 2021-11-12 VITALS — BP 128/96 | HR 4 | Temp 97.7°F | Resp 12 | Ht 69.0 in | Wt 174.0 lb

## 2021-11-12 DIAGNOSIS — L232 Allergic contact dermatitis due to cosmetics: Secondary | ICD-10-CM

## 2021-11-12 DIAGNOSIS — E7849 Other hyperlipidemia: Secondary | ICD-10-CM

## 2021-11-12 MED ORDER — HYDROXYZINE PAMOATE 25 MG PO CAPS: 25.0000 mg | ORAL_CAPSULE | Freq: Three times a day (TID) | ORAL | 0 refills | Status: DC | PRN

## 2021-11-12 MED ORDER — HYDROCORTISONE 0.5 % EX CREA: 1.0000 "application " | TOPICAL_CREAM | Freq: Two times a day (BID) | CUTANEOUS | 0 refills | Status: DC

## 2021-11-12 MED ORDER — METHYLPREDNISOLONE 4 MG PO TBPK: ORAL_TABLET | ORAL | 0 refills | Status: DC

## 2021-11-12 NOTE — Progress Notes (Signed)
Pt presents today for 3 month lipid panel. ?

## 2021-11-12 NOTE — Progress Notes (Signed)
? ?  Subjective: Rash  ? ? Patient ID: Jenna Holt, female    DOB: [birth year], 51 y.o.   MRN: [id number] ? ?HPI ?Patient presents with rash to the inferior anterior aspect of face upon a.m. awakening.  Patient states for the last 3 nights she has used a new facial cream.  Patient states itching associated with rash. ? ? ?Review of Systems ?Hyperlipidemia and hypertension ?   ?Objective:  ? Physical Exam ? ?Temperature is 97.7, respiration 12, BP is 128/96. ?Physical exam reveals maculopapular lesions on erythematous base inferior anterior mandible area. ? ?   ?Assessment & Plan: Contact dermatitis  ? ?Patient given a prescription for Medrol Dosepak, hydrocortisone cream, and Atarax.  Patient advised to follow-up in 5 days if no improvement or worsening complaint. ?

## 2021-11-12 NOTE — Progress Notes (Signed)
Pt woke up this morning with rash on face, pt states she's been using new cream at least 2-3 times prior before rash popped up this morning. ?

## 2021-11-13 LAB — LIPID PANEL
Chol/HDL Ratio: 4.6 ratio — ABNORMAL HIGH (ref 0.0–4.4)
Cholesterol, Total: 324 mg/dL — ABNORMAL HIGH (ref 100–199)
HDL: 71 mg/dL (ref >39)
LDL Chol Calc (NIH): 236 mg/dL — ABNORMAL HIGH (ref 0–99)
Triglycerides: 102 mg/dL (ref 0–149)
VLDL Cholesterol Cal: 17 mg/dL (ref 5–40)

## 2021-11-17 ENCOUNTER — Ambulatory Visit: Payer: 59 | Admitting: Physician Assistant

## 2021-11-19 ENCOUNTER — Encounter: Payer: Self-pay | Admitting: Physician Assistant

## 2021-11-19 ENCOUNTER — Ambulatory Visit: Payer: Self-pay | Admitting: Physician Assistant

## 2021-11-19 VITALS — BP 126/90 | HR 81 | Temp 97.8°F | Wt 174.0 lb

## 2021-11-19 DIAGNOSIS — E7800 Pure hypercholesterolemia, unspecified: Secondary | ICD-10-CM

## 2021-11-19 NOTE — Progress Notes (Signed)
? ?  Subjective: Hyperlipidemia  ? ? Patient ID: Jenna Holt, female    DOB: 20-Mar-1971, 51 y.o.   MRN: [id number] ? ?HPI ?Patient here today for 15-monthfollow-up status post Lipitor for elevated cholesterol.  Surprisingly patient cholesterol increased from 09/11/2022 taking 40 mg of Lipitor.  Patient readings 3 months ago was 284.  Patient triglycerides decreased in the past 3 months along with a decrease of HDL.  Patient denies change in eating habits or weight gain.  There is a 4 pound weight loss from last visit on 08/27/2021. ? ? ?Review of Systems ?Negative except for chief complaint ?   ?Objective:  ? Physical Exam ?Temperature is 97.8, pulse 81, BP is 126/90, and patient 90% O2 sat on room air. ?Breast exam was deferred. ? ? ?   ?Assessment & Plan: Hyperlipidemia.  ?Advised patient to continue previous medication and route will reschedule fasting lipid panel.  Patient will follow up status post labs. ? ?

## 2021-11-20 ENCOUNTER — Other Ambulatory Visit: Payer: Self-pay

## 2021-11-20 DIAGNOSIS — G8929 Other chronic pain: Secondary | ICD-10-CM

## 2021-11-20 MED ORDER — CYCLOBENZAPRINE HCL 10 MG PO TABS: 10.0000 mg | ORAL_TABLET | Freq: Two times a day (BID) | ORAL | 2 refills | Status: DC | PRN

## 2021-11-24 ENCOUNTER — Other Ambulatory Visit: Payer: Self-pay

## 2021-11-24 DIAGNOSIS — E7800 Pure hypercholesterolemia, unspecified: Secondary | ICD-10-CM

## 2021-11-24 NOTE — Progress Notes (Signed)
Pt presents today for lipid panel.  

## 2021-11-25 LAB — LIPID PANEL
Chol/HDL Ratio: 4.7 ratio — ABNORMAL HIGH (ref 0.0–4.4)
Cholesterol, Total: 297 mg/dL — ABNORMAL HIGH (ref 100–199)
HDL: 63 mg/dL (ref >39)
LDL Chol Calc (NIH): 214 mg/dL — ABNORMAL HIGH (ref 0–99)
Triglycerides: 114 mg/dL (ref 0–149)
VLDL Cholesterol Cal: 20 mg/dL (ref 5–40)

## 2021-12-15 ENCOUNTER — Ambulatory Visit
Admission: RE | Admit: 2021-12-15 | Discharge: 2021-12-15 | Disposition: A | Discharge status: Home / Self Care | Payer: 59 | Source: Ambulatory Visit | Attending: Physician Assistant | Admitting: Physician Assistant

## 2021-12-15 DIAGNOSIS — Z1231 Encounter for screening mammogram for malignant neoplasm of breast: Secondary | ICD-10-CM | POA: Insufficient documentation

## 2021-12-15 DIAGNOSIS — Z1239 Encounter for other screening for malignant neoplasm of breast: Secondary | ICD-10-CM | POA: Diagnosis present

## 2022-07-14 ENCOUNTER — Other Ambulatory Visit: Payer: Self-pay

## 2022-07-20 NOTE — Progress Notes (Signed)
cleared

## 2022-10-25 ENCOUNTER — Other Ambulatory Visit: Payer: Self-pay

## 2022-10-25 DIAGNOSIS — J0120 Acute ethmoidal sinusitis, unspecified: Secondary | ICD-10-CM

## 2022-10-25 DIAGNOSIS — R52 Pain, unspecified: Secondary | ICD-10-CM

## 2022-10-25 DIAGNOSIS — R509 Fever, unspecified: Secondary | ICD-10-CM

## 2022-10-25 LAB — POCT INFLUENZA A/B
Influenza A, POC: NEGATIVE
Influenza B, POC: NEGATIVE

## 2022-10-25 LAB — POC COVID19 BINAXNOW: SARS Coronavirus 2 Ag: NEGATIVE

## 2022-10-25 NOTE — Progress Notes (Signed)
Stated approximate one week of feeling sick with sinus congestion, body aches, some vertigo and cough off and on with sore throat.  Stated taking sinus and cold meds, but not Sudafed.  Tested for covid/flu with negative results to provider and pt.  OTC meds discussed with pt and questions with education about fluid intake and call if worsening symptoms or provider visit needed.  Stated she went to work today.

## 2022-11-01 ENCOUNTER — Telehealth: Payer: Self-pay

## 2022-11-01 DIAGNOSIS — Z1239 Encounter for other screening for malignant neoplasm of breast: Secondary | ICD-10-CM

## 2022-11-01 NOTE — Telephone Encounter (Signed)
Good Morning!!     I am due for my annual mammogram soon and would like to obtain a referral for [facility].     Thank you!!     Hope you have a wonderful week.     Jenna Holt

## 2022-12-08 DIAGNOSIS — Z1371 Encounter for nonprocreative screening for genetic disease carrier status: Secondary | ICD-10-CM

## 2022-12-08 HISTORY — DX: Encounter for nonprocreative screening for genetic disease carrier status: Z13.71

## 2022-12-23 ENCOUNTER — Ambulatory Visit
Admission: RE | Admit: 2022-12-23 | Discharge: 2022-12-23 | Disposition: A | Discharge status: Home / Self Care | Payer: BC Managed Care – PPO | Source: Ambulatory Visit | Attending: Physician Assistant | Admitting: Physician Assistant

## 2022-12-23 ENCOUNTER — Other Ambulatory Visit: Payer: Self-pay | Admitting: Physician Assistant

## 2022-12-23 DIAGNOSIS — Z1239 Encounter for other screening for malignant neoplasm of breast: Secondary | ICD-10-CM

## 2022-12-23 DIAGNOSIS — Z1231 Encounter for screening mammogram for malignant neoplasm of breast: Secondary | ICD-10-CM | POA: Insufficient documentation

## 2022-12-23 DIAGNOSIS — Z9882 Breast implant status: Secondary | ICD-10-CM | POA: Diagnosis not present

## 2022-12-26 NOTE — Progress Notes (Addendum)
PCP: Vena Austria, MD   Chief Complaint  Patient presents with   Gynecologic Exam    Hot flashes, night sweats, nauseous, headaches, joints hurt, fatigue, low sex drive    HPI:      [name] [facility] Jenna Holt is a 52 y.o. G1P1 whose LMP was No LMP recorded. Patient is perimenopausal., presents today for her NP> 3 yrs annual examination.  Her menses are absent due to menopause since ~12/22. No PMB. Does have hot flashes, night sweats, trouble sleeping, joint aches, decreased energy with exercise/exercise intolerance, fatigue since not sleeping. Has done multiple herbal/menopause support meds OTC without sx relief. Sx for 1+ yrs. Normal thyroid/CMP 1/23 with PCP. Is interested in tx, maybe HRT, has FH breast cancer on pat side.   Sex activity: single partner, contraception - post menopausal status. She does have vaginal dryness, improved with lubricants.   Last Pap: 09/07/19 Results were: no abnormalities /neg HPV DNA. No hx of abn paps with tx.   Last mammogram: 12/23/22 Results were: normal--routine follow-up in 12 months There is a FH of breast cancer in her pat uncle and pat cousin (is BRCA + and deceased), and MGGM. Pt had neg BRCA testing through [employer] (aware not medical grade testing). There is no FH of ovarian cancer. The patient does not do self-breast exams.  Colonoscopy: 10/22 with polyp at [facility]; Repeat due after 7 years.   Tobacco use: The patient denies current or previous tobacco use. Alcohol use: none No drug use Exercise: very active  She does get adequate calcium but not Vitamin D in her diet.  Labs with PCP.   Patient Active Problem List   Diagnosis Date Noted   Family history of breast cancer 12/27/2022   Hx of cold sores 10/26/2021   ADD (attention deficit disorder) 05/15/2019   Allergy 05/15/2019   Hyperlipidemia 05/15/2019   Elevated BP without diagnosis of hypertension 04/12/2019   Colon cancer screening 01/03/2019   Slow transit  constipation 01/03/2019   Chronic left hip pain 08/23/2017   Insomnia 11/05/2013    Past Surgical History:  Procedure Laterality Date   AUGMENTATION MAMMAPLASTY Bilateral    2013   COLONOSCOPY WITH PROPOFOL N/A 06/05/2021   Procedure: COLONOSCOPY WITH PROPOFOL;  Surgeon: Regis Bill, MD;  Location: [facility] ENDOSCOPY;  Service: Endoscopy;  Laterality: N/A;   HIP SURGERY Left 06/2019   SHOULDER ARTHROSCOPY      Family History  Problem Relation Age of Onset   Heart attack Mother    Lung cancer Father        smoker   Breast cancer Maternal Grandmother        unknown age   Breast cancer Paternal Uncle        34's   Breast cancer Cousin        75s; BRCA pos    Social History   Socioeconomic History   Marital status: Married    Spouse name: Not on file   Number of children: Not on file   Years of education: Not on file   Highest education level: Not on file  Occupational History   Not on file  Tobacco Use   Smoking status: Never   Smokeless tobacco: Never  Vaping Use   Vaping Use: Never used  Substance and Sexual Activity   Alcohol use: Yes    Comment: occ   Drug use: No   Sexual activity: Yes    Birth control/protection: None, Post-menopausal  Other  Topics Concern   Not on file  Social History Narrative   Not on file   Social Determinants of Health   Financial Resource Strain: Not on file  Food Insecurity: Not on file  Transportation Needs: Not on file  Physical Activity: Not on file  Stress: Not on file  Social Connections: Not on file  Intimate Partner Violence: Not on file     Current Outpatient Medications:    acyclovir ointment (ZOVIRAX) 5 %, Apply 1 application. topically every 3 (three) hours., Disp: 30 g, Rfl: 2   ciclopirox (LOPROX) 0.77 % cream, Apply topically 2 (two) times daily. Bid to feet and toes, Disp: 90 g, Rfl: 2   estradiol (ESTRACE) 0.5 MG tablet, Take 1 tablet (0.5 mg total) by mouth daily., Disp: 90 tablet, Rfl: 0    progesterone (PROMETRIUM) 100 MG capsule, Take 1 cap nightly, 6 nights on, 1 night off, Disp: 90 capsule, Rfl: 0   amitriptyline (ELAVIL) 50 MG tablet, Take 1 tablet (50 mg total) by mouth at bedtime as needed for sleep., Disp: 30 tablet, Rfl: 2     ROS:  Review of Systems  Constitutional:  Positive for fatigue. Negative for fever and unexpected weight change.  Respiratory:  Negative for cough, shortness of breath and wheezing.   Cardiovascular:  Negative for chest pain, palpitations and leg swelling.  Gastrointestinal:  Positive for nausea. Negative for blood in stool, constipation, diarrhea and vomiting.  Endocrine: Negative for cold intolerance, heat intolerance and polyuria.  Genitourinary:  Negative for dyspareunia, dysuria, flank pain, frequency, genital sores, hematuria, menstrual problem, pelvic pain, urgency, vaginal bleeding, vaginal discharge and vaginal pain.  Musculoskeletal:  Positive for arthralgias. Negative for back pain, joint swelling and myalgias.  Skin:  Negative for rash.  Neurological:  Positive for headaches. Negative for dizziness, syncope, light-headedness and numbness.  Hematological:  Negative for adenopathy.  Psychiatric/Behavioral:  Positive for agitation and dysphoric mood. Negative for confusion, sleep disturbance and suicidal ideas. The patient is not nervous/anxious.    BREAST: No symptoms    Objective: BP 108/70   Ht 5\' 9"  (1.753 m)   Wt 165 lb (74.8 kg)   BMI 24.37 kg/m    Physical Exam Constitutional:      Appearance: She is well-developed.  Genitourinary:     Vulva normal.     Right Labia: No rash, tenderness or lesions.    Left Labia: No tenderness, lesions or rash.    No vaginal discharge, erythema or tenderness.      Right Adnexa: not tender and no mass present.    Left Adnexa: not tender and no mass present.    No cervical friability or polyp.     Uterus is not enlarged or tender.  Breasts:    Right: No mass, nipple discharge,  skin change or tenderness.     Left: No mass, nipple discharge, skin change or tenderness.  Neck:     Thyroid: No thyromegaly.  Cardiovascular:     Rate and Rhythm: Normal rate and regular rhythm.     Heart sounds: Normal heart sounds. No murmur heard. Pulmonary:     Effort: Pulmonary effort is normal.     Breath sounds: Normal breath sounds.  Abdominal:     Palpations: Abdomen is soft.     Tenderness: There is no abdominal tenderness. There is no guarding or rebound.  Musculoskeletal:        General: Normal range of motion.     Cervical back: Normal range of  motion.  Lymphadenopathy:     Cervical: No cervical adenopathy.  Neurological:     General: No focal deficit present.     Mental Status: She is alert and oriented to person, place, and time.     Cranial Nerves: No cranial nerve deficit.  Skin:    General: Skin is warm and dry.  Psychiatric:        Mood and Affect: Mood normal.        Behavior: Behavior normal.        Thought Content: Thought content normal.        Judgment: Judgment normal.  Vitals reviewed.    Assessment/Plan:  Encounter for annual routine gynecological examination  Cervical cancer screening - Plan: Cytology - PAP  Screening for HPV (human papillomavirus) - Plan: Cytology - PAP  Encounter for screening mammogram for malignant neoplasm of breast; pt current on mammo  Family history of breast cancer - Plan: [facility]); MyRisk testing discussed and done today. Pt with BRCA mutation in her pat cousin but will do full panel testing today. Will f/u with results. Pt aware if positive for mutation, then will follow NCCN guidelines for increased screening/preventive options. If neg, may still be at increased risk of breast cancer per TC model and may qualify for addl breast screening options.   Vasomotor symptoms due to menopause - Plan: progesterone (PROMETRIUM) 100 MG capsule, estradiol (ESTRACE) 0.5 MG tablet;  discussed pros/cons/risks/benefits of HRT. Will try low dose estrogen/prometrium (pt's insurance doesn't cover meds so using goodrx coupon). Pt to f/u via MyChart msg in 3 mos/sooner prn. If sx persist, then needs further eval of sx to rule out other etiology.   Meds ordered this encounter  Medications   progesterone (PROMETRIUM) 100 MG capsule    Sig: Take 1 cap nightly, 6 nights on, 1 night off    Dispense:  90 capsule    Refill:  0    Order Specific Question:   Supervising Provider    Answer:   Hildred Laser [[id number]]   estradiol (ESTRACE) 0.5 MG tablet    Sig: Take 1 tablet (0.5 mg total) by mouth daily.    Dispense:  90 tablet    Refill:  0    Order Specific Question:   Supervising Provider    Answer:   Waymon Budge            GYN counsel breast self exam, mammography screening, menopause, adequate intake of calcium and vitamin D, diet and exercise    F/U  Return in about 1 year (around 12/27/2023).  Stefanee Mckell B. Saifan Rayford, PA-C 12/27/2022 9:51 AM

## 2022-12-27 ENCOUNTER — Ambulatory Visit (INDEPENDENT_AMBULATORY_CARE_PROVIDER_SITE_OTHER): Payer: BC Managed Care – PPO | Admitting: Obstetrics and Gynecology

## 2022-12-27 ENCOUNTER — Encounter: Payer: Self-pay | Admitting: Obstetrics and Gynecology

## 2022-12-27 ENCOUNTER — Other Ambulatory Visit (HOSPITAL_COMMUNITY)
Admission: RE | Admit: 2022-12-27 | Discharge: 2022-12-27 | Disposition: A | Discharge status: Home / Self Care | Payer: BC Managed Care – PPO | Source: Ambulatory Visit | Attending: Obstetrics and Gynecology | Admitting: Obstetrics and Gynecology

## 2022-12-27 VITALS — BP 108/70 | Ht 69.0 in | Wt 165.0 lb

## 2022-12-27 DIAGNOSIS — Z1151 Encounter for screening for human papillomavirus (HPV): Secondary | ICD-10-CM

## 2022-12-27 DIAGNOSIS — Z803 Family history of malignant neoplasm of breast: Secondary | ICD-10-CM | POA: Insufficient documentation

## 2022-12-27 DIAGNOSIS — Z01419 Encounter for gynecological examination (general) (routine) without abnormal findings: Secondary | ICD-10-CM

## 2022-12-27 DIAGNOSIS — Z1211 Encounter for screening for malignant neoplasm of colon: Secondary | ICD-10-CM

## 2022-12-27 DIAGNOSIS — Z124 Encounter for screening for malignant neoplasm of cervix: Secondary | ICD-10-CM | POA: Diagnosis not present

## 2022-12-27 DIAGNOSIS — Z1231 Encounter for screening mammogram for malignant neoplasm of breast: Secondary | ICD-10-CM

## 2022-12-27 DIAGNOSIS — N951 Menopausal and female climacteric states: Secondary | ICD-10-CM

## 2022-12-27 MED ORDER — PROGESTERONE MICRONIZED 100 MG PO CAPS: ORAL_CAPSULE | ORAL | 0 refills | Status: DC

## 2022-12-27 MED ORDER — ESTRADIOL 0.5 MG PO TABS: 0.5000 mg | ORAL_TABLET | Freq: Every day | ORAL | 0 refills | Status: DC

## 2022-12-27 NOTE — Patient Instructions (Signed)
I value your feedback and you entrusting us with your care. If you get a  patient survey, I would appreciate you taking the time to let us know about your experience today. Thank you! ? ? ?

## 2022-12-28 LAB — CYTOLOGY - PAP
Comment: NEGATIVE
Diagnosis: NEGATIVE
High risk HPV: NEGATIVE

## 2023-01-17 ENCOUNTER — Encounter: Payer: Self-pay | Admitting: Obstetrics and Gynecology

## 2023-01-18 ENCOUNTER — Telehealth: Payer: Self-pay | Admitting: Obstetrics and Gynecology

## 2023-01-18 NOTE — Telephone Encounter (Signed)
Pt aware of neg MyRisk results. Pat cousin with breast cancer is BRCA +. Pt's father passed with lung cancer but his brother also had breast cancer. IBIS=11.4%/Riskcore=15.1%. No increased screening recommendations for pt.  Patient understands these results only apply to her and her children, and this is not indicative of genetic testing results of her other family members. It is recommended that her other family members have genetic testing done.  Pt also understands negative genetic testing doesn't mean she will never get any of these cancers.   Hard copy mailed to pt. F/u prn.

## 2023-02-24 ENCOUNTER — Telehealth: Payer: Self-pay

## 2023-02-25 NOTE — Telephone Encounter (Signed)
Fax received and I have tried faxing clinical notes and fax machine is not working. I have tried multiple times. Will try again on Monday.

## 2023-03-01 NOTE — Telephone Encounter (Signed)
Multiple attempts to fax clinical documents failed. I have mailed clinical notes.

## 2023-03-13 ENCOUNTER — Other Ambulatory Visit: Payer: Self-pay | Admitting: Obstetrics and Gynecology

## 2023-03-13 DIAGNOSIS — N951 Menopausal and female climacteric states: Secondary | ICD-10-CM

## 2023-03-16 ENCOUNTER — Other Ambulatory Visit: Payer: Self-pay | Admitting: Obstetrics and Gynecology

## 2023-03-16 ENCOUNTER — Encounter: Payer: Self-pay | Admitting: Obstetrics and Gynecology

## 2023-03-16 DIAGNOSIS — N951 Menopausal and female climacteric states: Secondary | ICD-10-CM

## 2023-03-16 MED ORDER — PROGESTERONE MICRONIZED 100 MG PO CAPS: ORAL_CAPSULE | ORAL | 0 refills | Status: DC

## 2023-03-16 MED ORDER — ESTRADIOL 0.5 MG PO TABS: 0.5000 mg | ORAL_TABLET | Freq: Every day | ORAL | 0 refills | Status: DC

## 2023-03-16 NOTE — Progress Notes (Signed)
Rx RF HRT. Doing better but still having some energy issues and night sweats. Has been about 10 wks on Rxs.

## 2023-06-05 ENCOUNTER — Other Ambulatory Visit: Payer: Self-pay | Admitting: Obstetrics and Gynecology

## 2023-06-05 DIAGNOSIS — N951 Menopausal and female climacteric states: Secondary | ICD-10-CM

## 2023-06-08 ENCOUNTER — Other Ambulatory Visit: Payer: Self-pay | Admitting: Obstetrics and Gynecology

## 2023-06-08 DIAGNOSIS — N951 Menopausal and female climacteric states: Secondary | ICD-10-CM

## 2023-06-08 MED ORDER — ESTRADIOL 0.5 MG PO TABS: 0.5000 mg | ORAL_TABLET | Freq: Every day | ORAL | 0 refills | Status: DC

## 2023-07-16 ENCOUNTER — Encounter: Payer: Self-pay | Admitting: Obstetrics and Gynecology

## 2023-08-04 NOTE — Progress Notes (Signed)
Vena Austria, MD   Chief Complaint  Patient presents with   Vaginal Bleeding    Spotting for 1 week, no abnormal cramping.     HPI:      [name] is a 52 y.o. G1P1 whose LMP was Patient's last menstrual period was 07/19/2023 (approximate)., presents today for HRT f/u. Started estradiol 0.5 mg and prometrium 100 mg 5/24. Did really well with menopausal sx initially but not working as well now. Having increased hot flashes at night (no sweats), fatigue, joint pain, wt gain without diet/exercise changes. Also had 7 days light bleeding earlier this month; had occas BTB with wiping only before this/since HRT start (I was unaware of this). No PMB since. LMP ~12/22, no PMB prior to starting HRT. Neg pap 5/24. Jenna Holt is sexually active, no pain/bleeding. Pt also with some depression sx due to holidays/loss of fam members. Did paxil in past.  Annual due 5/25.  12/27/22 NOTE: Her menses are absent due to menopause since ~12/22. No PMB. Does have hot flashes, night sweats, trouble sleeping, joint aches, decreased energy with exercise/exercise intolerance, fatigue since not sleeping. Has done multiple herbal/menopause support meds OTC without sx relief.   Patient Active Problem List   Diagnosis Date Noted   Family history of breast cancer 12/27/2022   Hx of cold sores 10/26/2021   ADD (attention deficit disorder) 05/15/2019   Allergy 05/15/2019   Hyperlipidemia 05/15/2019   Elevated BP without diagnosis of hypertension 04/12/2019   Colon cancer screening 01/03/2019   Slow transit constipation 01/03/2019   Chronic left hip pain 08/23/2017   Insomnia 11/05/2013    Past Surgical History:  Procedure Laterality Date   AUGMENTATION MAMMAPLASTY Bilateral    2013   COLONOSCOPY WITH PROPOFOL N/A 06/05/2021   Procedure: COLONOSCOPY WITH PROPOFOL;  Surgeon: Regis Bill, MD;  Location: [facility] ENDOSCOPY;  Service: Endoscopy;  Laterality: N/A;   HIP SURGERY Left 06/2019    SHOULDER ARTHROSCOPY      Family History  Problem Relation Age of Onset   Heart attack Mother    Lung cancer Father        smoker   Breast cancer Maternal Grandmother        unknown age   Breast cancer Paternal Uncle        65's   Breast cancer Cousin        21s; BRCA pos    Social History   Socioeconomic History   Marital status: Married    Spouse name: Not on file   Number of children: Not on file   Years of education: Not on file   Highest education level: Not on file  Occupational History   Not on file  Tobacco Use   Smoking status: Never   Smokeless tobacco: Never  Vaping Use   Vaping status: Never Used  Substance and Sexual Activity   Alcohol use: Yes    Comment: occ   Drug use: No   Sexual activity: Yes    Birth control/protection: Post-menopausal  Other Topics Concern   Not on file  Social History Narrative   Not on file   Social Drivers of Health   Financial Resource Strain: Not on file  Food Insecurity: Not on file  Transportation Needs: Not on file  Physical Activity: Not on file  Stress: Not on file  Social Connections: Not on file  Intimate Partner Violence: Not on file    Outpatient Medications Prior to Visit  Medication  Sig Dispense Refill   acyclovir ointment (ZOVIRAX) 5 % Apply 1 application. topically every 3 (three) hours. 30 g 2   ciclopirox (LOPROX) 0.77 % cream Apply topically 2 (two) times daily. Bid to feet and toes 90 g 2   cyclobenzaprine (FLEXERIL) 10 MG tablet Take 1 tablet by mouth as needed for muscle spasms.     estradiol (ESTRACE) 0.5 MG tablet Take 1 tablet (0.5 mg total) by mouth daily. 90 tablet 0   progesterone (PROMETRIUM) 100 MG capsule TAKE 1 CAP NIGHTLY, 6 NIGHTS ON, 1 NIGHT OFF 90 capsule 1   amitriptyline (ELAVIL) 50 MG tablet Take 1 tablet (50 mg total) by mouth at bedtime as needed for sleep. 30 tablet 2   No facility-administered medications prior to visit.      ROS:  Review of Systems   Constitutional:  Negative for fever.  Gastrointestinal:  Negative for blood in stool, constipation, diarrhea, nausea and vomiting.  Genitourinary:  Positive for vaginal bleeding. Negative for dyspareunia, dysuria, flank pain, frequency, hematuria, urgency, vaginal discharge and vaginal pain.  Musculoskeletal:  Positive for arthralgias. Negative for back pain.  Skin:  Negative for rash.  Psychiatric/Behavioral:  Positive for agitation.    BREAST: No symptoms   OBJECTIVE:   Vitals:  BP 115/76   Pulse 73   Ht 5\' 9"  (1.753 m)   Wt 178 lb (80.7 kg)   LMP 07/19/2023 (Approximate)   BMI 26.29 kg/m   Physical Exam Vitals reviewed.  Constitutional:      Appearance: Jenna Holt is well-developed.  Pulmonary:     Effort: Pulmonary effort is normal.  Genitourinary:    General: Normal vulva.     Pubic Area: No rash.      Labia:        Right: No rash, tenderness or lesion.        Left: No rash, tenderness or lesion.      Vagina: Normal. No vaginal discharge, erythema or tenderness.     Cervix: Normal.     Uterus: Normal. Not enlarged and not tender.      Adnexa: Right adnexa normal and left adnexa normal.       Right: No mass or tenderness.         Left: No mass or tenderness.    Musculoskeletal:        General: Normal range of motion.     Cervical back: Normal range of motion.  Skin:    General: Skin is warm and dry.  Neurological:     General: No focal deficit present.     Mental Status: Jenna Holt is alert and oriented to person, place, and time.  Psychiatric:        Mood and Affect: Mood normal.        Behavior: Behavior normal.        Thought Content: Thought content normal.        Judgment: Judgment normal.    Assessment/Plan: Vasomotor symptoms due to menopause - Plan: estradiol (ESTRACE) 0.5 MG tablet, progesterone (PROMETRIUM) 100 MG capsule; cont estradiol, increase prog to 150 mg dose, Rx eRxd. Pt to f/u in 3 months re: sx via MyChart/sooner prn. If doing ok, will RF meds.    Hormone replacement therapy (HRT) - Plan: estradiol (ESTRACE) 0.5 MG tablet, progesterone (PROMETRIUM) 100 MG capsule  PMB (postmenopausal bleeding)--most likely due to HRT. Increase prog to 150 mg dose, cont estradiol 0.5 mg. Pt to f/u with any further bleeding/spotting for GYN u/s.   Depression--discussed mood  sx need psych meds. If sx persist after holidays, pt to f/u with PCP re: tx.    Meds ordered this encounter  Medications   estradiol (ESTRACE) 0.5 MG tablet    Sig: Take 1 tablet (0.5 mg total) by mouth daily.    Dispense:  90 tablet    Refill:  0    Supervising Provider:   Hildred Laser [[id number]]   progesterone (PROMETRIUM) 100 MG capsule    Sig: Take 1-2 caps nightly, 6 nights on, 1 night off    Dispense:  135 capsule    Refill:  0    Supervising Provider:   Hildred Laser [[id number]]      Return if symptoms worsen or fail to improve.  Izan Miron B. Kyia Rhude, PA-C 08/05/2023 10:17 AM

## 2023-08-05 ENCOUNTER — Encounter: Payer: Self-pay | Admitting: Obstetrics and Gynecology

## 2023-08-05 ENCOUNTER — Ambulatory Visit: Payer: BC Managed Care – PPO | Admitting: Obstetrics and Gynecology

## 2023-08-05 VITALS — BP 115/76 | HR 73 | Ht 69.0 in | Wt 178.0 lb

## 2023-08-05 DIAGNOSIS — N951 Menopausal and female climacteric states: Secondary | ICD-10-CM

## 2023-08-05 DIAGNOSIS — Z79890 Hormone replacement therapy (postmenopausal): Secondary | ICD-10-CM | POA: Diagnosis not present

## 2023-08-05 DIAGNOSIS — N950 Postmenopausal bleeding: Secondary | ICD-10-CM

## 2023-08-05 MED ORDER — PROGESTERONE MICRONIZED 100 MG PO CAPS: ORAL_CAPSULE | ORAL | 0 refills | Status: DC

## 2023-08-05 MED ORDER — ESTRADIOL 0.5 MG PO TABS: 0.5000 mg | ORAL_TABLET | Freq: Every day | ORAL | 0 refills | Status: DC

## 2023-08-05 NOTE — Patient Instructions (Signed)
I value your feedback and you entrusting us with your care. If you get a [facility] patient survey, I would appreciate you taking the time to let us know about your experience today. Thank you! ? ? ?

## 2023-08-16 ENCOUNTER — Ambulatory Visit: Payer: Self-pay | Admitting: Physician Assistant

## 2023-08-16 ENCOUNTER — Encounter: Payer: Self-pay | Admitting: Physician Assistant

## 2023-08-16 VITALS — BP 131/92 | HR 81 | Temp 97.7°F | Resp 12

## 2023-08-16 DIAGNOSIS — J0100 Acute maxillary sinusitis, unspecified: Secondary | ICD-10-CM

## 2023-08-16 MED ORDER — CLARITHROMYCIN 500 MG PO TABS
500.0000 mg | ORAL_TABLET | Freq: Two times a day (BID) | ORAL | 0 refills | Status: DC
Start: 2023-08-16 — End: 2023-09-06

## 2023-08-16 MED ORDER — FEXOFENADINE-PSEUDOEPHED ER 60-120 MG PO TB12: 1.0000 | ORAL_TABLET | Freq: Two times a day (BID) | ORAL | 0 refills | Status: DC

## 2023-08-16 NOTE — Progress Notes (Signed)
   Subjective: Sinus congestion    Patient ID: Jenna Holt, female    DOB: [birth year], 53 y.o.   MRN: [id number]  HPI Patient complain of sinus congestion for approximately 4 weeks.  States she is also having upper teeth pain which started last week.  Denies fever/chills.  No recent travel or known contact with COVID-19.  Unsure of fever but has chills.  Mild sore throat secondary to postnasal drainage.  Mild cough.  No relief with over-the-counter decongestant.   Review of Systems Hyperlipidemia insomnia    Objective:   Physical Exam BP 131/92BP. 131/92. Data is abnormal. Taken on 08/16/23 9:18 AM  BP Location Right Arm  Patient Position Sitting  Cuff Size Normal  Pulse Rate 81  Temp 97.7 F (36.5 C)  Temp Source Temporal  Resp 12  SpO2 97 %  No acute distress. HEENT is remarkable edematous nasal turbinates and bilateral maxillary guarding palpation. Neck is supple for lymphadenopathy or bruits. Lungs are clear to auscultation. Heart regular rate and rhythm.       Assessment & Plan: Subacute maxillary sinusitis  Patient given a prescription for amoxicillin and Allegra -D.  Advised to follow-up if no improvement in 1 week.

## 2023-08-16 NOTE — Progress Notes (Signed)
 S/Sx started 3-4 weeks ago Achy teeth Bilateral Ear discomfort - pain & pressure Head congestion General malaise Intermittent Headache - feels pressure in her head Pressure behind eyes Intermittent Cough - dry Nasal drainage  Denies sore throat  OTC Products - Phenylephrine 

## 2023-09-01 ENCOUNTER — Ambulatory Visit: Payer: Self-pay

## 2023-09-01 DIAGNOSIS — Z0000 Encounter for general adult medical examination without abnormal findings: Secondary | ICD-10-CM

## 2023-09-01 LAB — POCT URINALYSIS DIPSTICK
Bilirubin, UA: NEGATIVE
Blood, UA: NEGATIVE
Glucose, UA: NEGATIVE
Ketones, UA: NEGATIVE
Leukocytes, UA: NEGATIVE
Nitrite, UA: NEGATIVE
Protein, UA: NEGATIVE
Spec Grav, UA: 1.01 (ref 1.010–1.025)
Urobilinogen, UA: 0.2 U/dL
pH, UA: 6 (ref 5.0–8.0)

## 2023-09-01 NOTE — Progress Notes (Signed)
Pt completed required labs for physical. Jenna Holt

## 2023-09-02 LAB — CMP12+LP+TP+TSH+6AC+CBC/D/PLT
ALT: 17 [IU]/L (ref 0–32)
AST: 29 [IU]/L (ref 0–40)
Albumin: 4.9 g/dL (ref 3.8–4.9)
Alkaline Phosphatase: 128 [IU]/L — ABNORMAL HIGH (ref 44–121)
BUN/Creatinine Ratio: 14 (ref 9–23)
BUN: 13 mg/dL (ref 6–24)
Basophils Absolute: 0 10*3/uL (ref 0.0–0.2)
Basos: 1 %
Bilirubin Total: 0.3 mg/dL (ref 0.0–1.2)
Calcium: 9.6 mg/dL (ref 8.7–10.2)
Chloride: 104 mmol/L (ref 96–106)
Chol/HDL Ratio: 4.2 {ratio} (ref 0.0–4.4)
Cholesterol, Total: 304 mg/dL — ABNORMAL HIGH (ref 100–199)
Creatinine, Ser: 0.9 mg/dL (ref 0.57–1.00)
EOS (ABSOLUTE): 0.1 10*3/uL (ref 0.0–0.4)
Eos: 2 %
Estimated CHD Risk: 0.9 times avg. (ref 0.0–1.0)
Free Thyroxine Index: 2.4 (ref 1.2–4.9)
GGT: 11 [IU]/L (ref 0–60)
Globulin, Total: 2.4 g/dL (ref 1.5–4.5)
Glucose: 84 mg/dL (ref 70–99)
HDL: 73 mg/dL (ref >39)
Hematocrit: 42.5 % (ref 34.0–46.6)
Hemoglobin: 14 g/dL (ref 11.1–15.9)
Immature Grans (Abs): 0 10*3/uL (ref 0.0–0.1)
Immature Granulocytes: 0 %
Iron: 95 ug/dL (ref 27–159)
LDH: 234 [IU]/L — ABNORMAL HIGH (ref 119–226)
LDL Chol Calc (NIH): 221 mg/dL — ABNORMAL HIGH (ref 0–99)
Lymphocytes Absolute: 2.4 10*3/uL (ref 0.7–3.1)
Lymphs: 39 %
MCH: 29.4 pg (ref 26.6–33.0)
MCHC: 32.9 g/dL (ref 31.5–35.7)
MCV: 89 fL (ref 79–97)
Monocytes Absolute: 0.7 10*3/uL (ref 0.1–0.9)
Monocytes: 11 %
Neutrophils Absolute: 2.9 10*3/uL (ref 1.4–7.0)
Neutrophils: 47 %
Phosphorus: 3.4 mg/dL (ref 3.0–4.3)
Platelets: 280 10*3/uL (ref 150–450)
Potassium: 4.2 mmol/L (ref 3.5–5.2)
RBC: 4.76 x10E6/uL (ref 3.77–5.28)
RDW: 13 % (ref 11.7–15.4)
Sodium: 141 mmol/L (ref 134–144)
T3 Uptake Ratio: 29 % (ref 24–39)
T4, Total: 8.3 ug/dL (ref 4.5–12.0)
TSH: 2.16 u[IU]/mL (ref 0.450–4.500)
Total Protein: 7.3 g/dL (ref 6.0–8.5)
Triglycerides: 68 mg/dL (ref 0–149)
Uric Acid: 3.5 mg/dL (ref 3.0–7.2)
VLDL Cholesterol Cal: 10 mg/dL (ref 5–40)
WBC: 6.2 10*3/uL (ref 3.4–10.8)
eGFR: 76 mL/min/{1.73_m2} (ref >59)

## 2023-09-06 ENCOUNTER — Ambulatory Visit: Payer: Self-pay

## 2023-09-06 VITALS — BP 121/81 | HR 73 | Temp 97.1°F | Resp 12 | Ht 69.0 in | Wt 174.0 lb

## 2023-09-06 DIAGNOSIS — Z0000 Encounter for general adult medical examination without abnormal findings: Secondary | ICD-10-CM

## 2023-09-06 NOTE — Progress Notes (Signed)
Jenna Holt, 53 y.o. female, presents for wellness exam for insurance benefit PCP: Vena Austria, MD   Last Labs on 09/01/2023  Component Ref Range & Units (hover) 5 d ago (09/01/23) 2 yr ago (08/18/21) 2 yr ago (10/22/20) 3 yr ago (09/12/19) 4 yr ago (05/08/19)  Color, UA light yellow Light Yellow yellow yellow Yellow  Clarity, UA clear Clear clear clear Clear  Glucose, UA Negative Negative Negative Negative Negative  Bilirubin, UA neg Negative negative negative Negative  Ketones, UA neg Negative negative negative Negative  Spec Grav, UA 1.010 1.010 <=1.005 Abnormal  1.015 1.020  Blood, UA neg Negative negative negative Negative  pH, UA 6.0 6.0 6.0 6.0 6.0  Protein, UA Negative Negative Negative Negative Negative  Urobilinogen, UA 0.2 0.2 0.2 0.2 0.2  Nitrite, UA neg Negative negative negative Negative  Leukocytes, UA Negative Small (1+) Abnormal  Negative Negative Negative  Appearance   clear    Odor              Component Ref Range & Units (hover) 5 d ago (09/01/23) 1 yr ago (11/24/21) 1 yr ago (11/12/21) 2 yr ago (08/18/21) 2 yr ago (11/27/20) 2 yr ago (10/22/20) 3 yr ago (09/12/19)  Glucose 84   87 70 R 88 R 93 R  Uric Acid 3.5   3.7 CM  4.4 CM 4.2 CM  Comment:            Therapeutic target for gout patients: <6.0  BUN 13   11 18 20 10   Creatinine, Ser 0.90   0.87 0.99 1.13 High  0.81  eGFR 76   80 69 59 Low    BUN/Creatinine Ratio 14   13 18 18 12   Sodium 141   142 146 High  140 141  Potassium 4.2   4.4 4.5 4.3 4.4  Chloride 104   101 103 98 103  Calcium 9.6   10.0 9.6 9.5 9.4  Phosphorus 3.4   4.8 High   4.9 High  3.5  Total Protein 7.3   7.5 7.5 7.2 7.2  Albumin 4.9   5.0 High  4.9 4.8 4.8  Globulin, Total 2.4   2.5 2.6 2.4 2.4  Bilirubin Total 0.3   0.4 0.4 0.4 0.3  Alkaline Phosphatase 128 High    95 99 86 76 R  LDH 234 High    252 High   265 High  290 High   AST 29   44 High  33 34 46 High   ALT 17   23 19 17 26   GGT 11   14  10 13   Iron 95    96  109 131  Cholesterol, Total 304 High  297 High  324 High  284 High   236 High  243 High   Triglycerides 68 114 102 127  65 76  HDL 73 63 71 78  71 70  VLDL Cholesterol Cal 10 20 17 22  11 13   LDL Chol Calc (NIH) 221 High  214 High  236 High  184 High   154 High  160 High   LDL CALC COMMENT: Comment        Comment: Consider evaluating for Familial Hypercholesterolemia(FH), if clinically indicated.  Chol/HDL Ratio 4.2 4.7 High  CM 4.6 High  CM 3.6 CM  3.3 CM 3.5 CM  Comment:  T. Chol/HDL Ratio                                             Men  Women                               1/2 Avg.Risk  3.4    3.3                                   Avg.Risk  5.0    4.4                                2X Avg.Risk  9.6    7.1                                3X Avg.Risk 23.4   11.0  Estimated CHD Risk 0.9   0.7 CM   < 0.5 CM 0.6 CM  Comment: The CHD Risk is based on the T. Chol/HDL ratio. Other factors affect CHD Risk such as hypertension, smoking, diabetes, severe obesity, and family history of premature CHD.  TSH 2.160   1.900  1.540 1.350  T4, Total 8.3   7.4  8.1 6.1  T3 Uptake Ratio 29   32  30 27  Free Thyroxine Index 2.4   2.4  2.4 1.6  WBC 6.2   5.8  7.6 5.6  RBC 4.76   4.76  4.65 4.55  Hemoglobin 14.0   14.0  13.9 13.5  Hematocrit 42.5   42.1  41.5 40.7  MCV 89   88  89 90  MCH 29.4   29.4  29.9 29.7  MCHC 32.9   33.3  33.5 33.2  RDW 13.0   13.0  12.8 12.8  Platelets 280   290  282 278  Neutrophils 47   45  60 48  Lymphs 39   40  28 37  Monocytes 11   12  10 11   Eos 2   2  1 3   Basos 1   1  1 1   Neutrophils Absolute 2.9   2.6  4.6 2.8  Lymphocytes Absolute 2.4   2.3  2.1 2.0  Monocytes Absolute 0.7   0.7  0.8 0.6  EOS (ABSOLUTE) 0.1   0.1  0.1 0.1  Basophils Absolute 0.0   0.0  0.1 0.0  Immature Granulocytes 0   0  0 0  Immature Grans (Abs) 0.0   0.0  0.0 0.0  Resulting Agency [facility]             Vaccinations Immunization History  Administered Date(s) Administered   Influenza,inj,Quad PF,6+ Mos 06/18/2014, 07/15/2015, 06/07/2016, 06/28/2017, 05/08/2019, 06/12/2020   Influenza-Unspecified 06/18/2014, 07/15/2015, 06/19/2018, 06/11/2021   PFIZER(Purple Top)SARS-COV-2 Vaccination 02/08/2020, 02/29/2020   Tdap 01/15/2013   Zoster Recombinant(Shingrix) 02/13/2021, 04/23/2021      Health Maintenance Health Maintenance  Topic Date Due   HIV Screening  Never done   Hepatitis C Screening  Never done   DTaP/Tdap/Td (2 - Td or Tdap) 01/16/2023   INFLUENZA VACCINE  03/10/2023   COVID-19 Vaccine (  3 - 2024-25 season) 04/10/2023   MAMMOGRAM  12/22/2024   Cervical Cancer Screening (HPV/Pap Cotest)  12/27/2027   Colonoscopy  06/06/2031   Zoster Vaccines- Shingrix  Completed   HPV VACCINES  Aged Out      Lifestyle: Diet: healthy eating habits Exercise: regularly   Social History   Tobacco Use  Smoking Status Never  Smokeless Tobacco Never     Alcohol use:   Social History   Substance and Sexual Activity  Alcohol Use Yes   Comment: occ      Current Vital signs:  Vitals:   09/06/23 0804  Weight: 174 lb (78.9 kg)  Height: 5\' 9"  (1.753 m)     Previous vital signs:     09/06/2023    8:04 AM  Vitals with BMI  Height 5\' 9"   Weight 174 lbs  BMI 25.68  Systolic 121  Diastolic 81  Pulse 73      Past Medical History:  Diagnosis Date   BRCA negative 12/2022   MyRisk neg; IBIS=11.4%/riskscore=15.1%   Family history of breast cancer in female    pat uncle; his daughter is BRCA pos; pt is BRCA NEG   High cholesterol      Current Outpatient Medications on File Prior to Visit  Medication Sig Dispense Refill   estradiol (ESTRACE) 0.5 MG tablet Take 1 tablet (0.5 mg total) by mouth daily. 90 tablet 0   progesterone (PROMETRIUM) 100 MG capsule Take 1-2 caps nightly, 6 nights on, 1 night off 135 capsule 0   acyclovir ointment (ZOVIRAX) 5 % Apply 1  application. topically every 3 (three) hours. 30 g 2   amitriptyline (ELAVIL) 50 MG tablet Take 1 tablet (50 mg total) by mouth at bedtime as needed for sleep. 30 tablet 2   ciclopirox (LOPROX) 0.77 % cream Apply topically 2 (two) times daily. Bid to feet and toes 90 g 2   cyclobenzaprine (FLEXERIL) 10 MG tablet Take 1 tablet by mouth as needed for muscle spasms.     No current facility-administered medications on file prior to visit.      Physical Exam Constitutional:      Appearance: Normal appearance.  HENT:     Head: Normocephalic and atraumatic.     Right Ear: Tympanic membrane, ear canal and external ear normal.     Left Ear: Tympanic membrane, ear canal and external ear normal.     Nose: Nose normal.     Mouth/Throat:     Mouth: Mucous membranes are moist.     Pharynx: Oropharynx is clear. No oropharyngeal exudate or posterior oropharyngeal erythema.  Eyes:     Extraocular Movements: Extraocular movements intact.     Conjunctiva/sclera: Conjunctivae normal.     Pupils: Pupils are equal, round, and reactive to light.  Cardiovascular:     Rate and Rhythm: Normal rate and regular rhythm.     Heart sounds: Normal heart sounds. No murmur heard.    No friction rub. No gallop.  Pulmonary:     Effort: Pulmonary effort is normal.     Breath sounds: Normal breath sounds.  Abdominal:     General: Bowel sounds are normal.     Palpations: Abdomen is soft.  Musculoskeletal:        General: No tenderness. Normal range of motion.     Cervical back: Normal range of motion and neck supple. No tenderness or bony tenderness. Normal range of motion.     Thoracic back: No tenderness or bony tenderness.  Lumbar back: No tenderness or bony tenderness.  Skin:    General: Skin is warm and dry.  Neurological:     General: No focal deficit present.     Mental Status: She is alert.     Cranial Nerves: No cranial nerve deficit.     Sensory: No sensory deficit.     Motor: No weakness.      Deep Tendon Reflexes: Reflexes normal.  Psychiatric:        Mood and Affect: Mood normal.      Assessment/Plan/Recommendation:  Health Maintenance Due  Topic Date Due   HIV Screening  Never done   Hepatitis C Screening  Never done   DTaP/Tdap/Td (2 - Td or Tdap) 01/16/2023   INFLUENZA VACCINE  03/10/2023   COVID-19 Vaccine (3 - 2024-25 season) 04/10/2023

## 2023-10-31 ENCOUNTER — Other Ambulatory Visit: Payer: Self-pay | Admitting: Obstetrics and Gynecology

## 2023-10-31 DIAGNOSIS — Z79890 Hormone replacement therapy (postmenopausal): Secondary | ICD-10-CM

## 2023-10-31 DIAGNOSIS — N951 Menopausal and female climacteric states: Secondary | ICD-10-CM

## 2023-11-09 ENCOUNTER — Telehealth: Payer: Self-pay

## 2023-11-09 DIAGNOSIS — Z1239 Encounter for other screening for malignant neoplasm of breast: Secondary | ICD-10-CM

## 2023-11-09 NOTE — Telephone Encounter (Signed)
 Jenna Holt sent an email requesting order for her yearly mammogram.  Order for MM 3D Screen Breast W/Implant Bilateral put in Epic.  Elly notified to call [facility] to schedule mammogram appointment.

## 2023-11-22 ENCOUNTER — Ambulatory Visit: Payer: Self-pay | Admitting: Student

## 2023-11-22 ENCOUNTER — Encounter: Payer: Self-pay | Admitting: Student

## 2023-11-22 VITALS — BP 130/88 | HR 71 | Temp 98.0°F | Resp 14 | Ht 69.0 in | Wt 168.0 lb

## 2023-11-22 DIAGNOSIS — M25512 Pain in left shoulder: Secondary | ICD-10-CM

## 2023-11-22 NOTE — Progress Notes (Signed)
 Left shoulder pain w/ protrusion on top. Noticed it 10/31/23

## 2023-11-22 NOTE — Progress Notes (Signed)
   Acute Office Visit  Subjective:     Patient ID: Jenna Holt, female    DOB: 1971/05/06, 53 y.o.   MRN: [id number]  Chief Complaint  Patient presents with   Shoulder Pain    HPI Patient is in today for evaluation of left shoulder.  Complains of some soreness through the superior aspect of the shoulder for unspecified amount of time.  She was getting a tattoo on 10/31/2023 when abnormality at the distal clavicle was noted.  Denies any associated trauma or injury.  History of arthroscopic surgery in [place] .  Complains of constant but tolerable ache and trouble sleeping on the left side for extended periods of time.  Denies any limitations in range of motion.        Objective:    BP 130/88 (Cuff Size: Normal)   Pulse 71   Temp 98 F (36.7 C) (Temporal)   Resp 14   Ht 5\' 9"  (1.753 m)   Wt 168 lb (76.2 kg)   LMP 07/19/2023 (Approximate)   SpO2 100%   BMI 24.81 kg/m    Physical Exam Constitutional:      Appearance: Normal appearance.  HENT:     Head: Normocephalic.  Eyes:     Conjunctiva/sclera: Conjunctivae normal.  Musculoskeletal:     Right shoulder: Normal.     Left shoulder: Deformity and tenderness present.     Cervical back: Normal range of motion.     Comments: Protrusion at the [facility] joint with mild tenderness to palpation. Slightly more tenderness at bicep tendon area.   Neurological:     Mental Status: She is alert.     No results found for any visits on 11/22/23.      Assessment & Plan:  Impression: Left shoulder pain Plan: Voltaren gel, consistent use of NSAID Discussed physical exam findings with patient.  Complains of persistent, dull achiness throughout the shoulder recently but did not feel that it limited her in any significant way.  Notes that while she was getting a tattoo she noticed a lump at the distal clavicle and presents today for evaluation.  Range of motion intact throughout the shoulder.  Strength intact throughout the  shoulder.  She does have a mildly tender protrusion of the clavicle at the [facility] joint.  Also exhibiting tenderness at the anterior shoulder at the bicep tendon.  No correlating trauma or injury.  Exam is reassuring.  Recommended conservative management with Voltaren gel, and if needed consistent use of anti-inflammatory.  Discussed x-ray today versus follow-up with orthopedics for further evaluation.  She would like to proceed with further evaluation with orthopedic if needed. Problem List Items Addressed This Visit   None   No orders of the defined types were placed in this encounter.   No follow-ups on file.  Guadelupe Leech, PA-C

## 2023-11-24 ENCOUNTER — Encounter: Payer: Self-pay | Admitting: Obstetrics and Gynecology

## 2023-11-24 DIAGNOSIS — N950 Postmenopausal bleeding: Secondary | ICD-10-CM

## 2023-12-02 ENCOUNTER — Ambulatory Visit
Admission: RE | Admit: 2023-12-02 | Discharge: 2023-12-02 | Disposition: A | Discharge status: Home / Self Care | Source: Ambulatory Visit | Attending: Obstetrics and Gynecology | Admitting: Obstetrics and Gynecology

## 2023-12-02 DIAGNOSIS — N950 Postmenopausal bleeding: Secondary | ICD-10-CM | POA: Insufficient documentation

## 2023-12-02 DIAGNOSIS — Z79890 Hormone replacement therapy (postmenopausal): Secondary | ICD-10-CM | POA: Diagnosis not present

## 2023-12-05 ENCOUNTER — Encounter: Payer: Self-pay | Admitting: Obstetrics and Gynecology

## 2023-12-11 NOTE — Telephone Encounter (Signed)
 Pls call radiology and request u/s read since pt is still sx. Thx.

## 2023-12-12 NOTE — Telephone Encounter (Signed)
 Secure Chat to US  sent.

## 2023-12-13 ENCOUNTER — Encounter: Payer: Self-pay | Admitting: Obstetrics and Gynecology

## 2023-12-14 NOTE — Progress Notes (Unsigned)
 Darl Edu, MD   No chief complaint on file.   HPI:      [name] is a 53 y.o. G1P1 whose LMP was Patient's last menstrual period was 07/19/2023 (approximate)., presents today for EMB for PMB   Started estradiol  0.5 mg and prometrium  100 mg 5/24. Did really well with menopausal sx initially but not working as well now. Having increased hot flashes at night (no sweats), fatigue, joint pain, wt gain without diet/exercise changes. Also had 7 days light bleeding earlier this month; had occas BTB with wiping only before this/since HRT start (I was unaware of this). No PMB since. LMP ~12/22, no PMB prior to starting HRT. Neg pap 5/24.  12/24 NOTE:    4/25 Gyn u/s:   IMPRESSION: 1. Endometrial thickness of 16 mm with multifocal subcentimeter cysts and areas of increased focal vascularity. Findings are suspicious for endometrial hyperplasia or carcinoma. 2. Intramural ovoid hypoechogenicity located within the anterior lower uterine segment measures 0.9 cm, likely a small leiomyoma if there has been no history of prior cesarean section.  Patient Active Problem List   Diagnosis Date Noted   Family history of breast cancer 12/27/2022   Hx of cold sores 10/26/2021   ADD (attention deficit disorder) 05/15/2019   Allergy 05/15/2019   Hyperlipidemia 05/15/2019   Elevated BP without diagnosis of hypertension 04/12/2019   Colon cancer screening 01/03/2019   Slow transit constipation 01/03/2019   Chronic left hip pain 08/23/2017   Insomnia 11/05/2013    Past Surgical History:  Procedure Laterality Date   AUGMENTATION MAMMAPLASTY Bilateral    2013   COLONOSCOPY WITH PROPOFOL  N/A 06/05/2021   Procedure: COLONOSCOPY WITH PROPOFOL ;  Surgeon: Shane Darling, MD;  Location: [facility] ENDOSCOPY;  Service: Endoscopy;  Laterality: N/A;   HIP SURGERY Left 06/2019   SHOULDER ARTHROSCOPY      Family History  Problem Relation Age of Onset   Heart attack Mother     Lung cancer Father        smoker   Breast cancer Maternal Grandmother        unknown age   Breast cancer Paternal Uncle        31's   Breast cancer Cousin        47s; BRCA pos    Social History   Socioeconomic History   Marital status: Married    Spouse name: Not on file   Number of children: Not on file   Years of education: Not on file   Highest education level: Not on file  Occupational History   Not on file  Tobacco Use   Smoking status: Never   Smokeless tobacco: Never  Vaping Use   Vaping status: Never Used  Substance and Sexual Activity   Alcohol use: Yes    Comment: occ   Drug use: No   Sexual activity: Yes    Birth control/protection: Post-menopausal  Other Topics Concern   Not on file  Social History Narrative   Not on file   Social Drivers of Health   Financial Resource Strain: Not on file  Food Insecurity: Not on file  Transportation Needs: Not on file  Physical Activity: Not on file  Stress: Not on file  Social Connections: Not on file  Intimate Partner Violence: Not on file    Outpatient Medications Prior to Visit  Medication Sig Dispense Refill   acyclovir  ointment (ZOVIRAX ) 5 % Apply 1 application. topically every 3 (three) hours. 30 g  2   ciclopirox  (LOPROX ) 0.77 % cream Apply topically 2 (two) times daily. Bid to feet and toes 90 g 2   cyclobenzaprine  (FLEXERIL ) 10 MG tablet Take 1 tablet by mouth as needed for muscle spasms.     estradiol  (ESTRACE ) 0.5 MG tablet Take 1 tablet (0.5 mg total) by mouth daily. 90 tablet 0   progesterone  (PROMETRIUM ) 100 MG capsule Take 1-2 caps nightly, 6 nights on, 1 night off 135 capsule 0   No facility-administered medications prior to visit.      ROS:  Review of Systems BREAST: No symptoms   OBJECTIVE:   Vitals:  LMP 07/19/2023 (Approximate)   Physical Exam   Endometrial Biopsy After discussion with the patient regarding her abnormal uterine bleeding I recommended that she proceed with an  endometrial biopsy for further diagnosis. The risks, benefits, alternatives, and indications for an endometrial biopsy were discussed with the patient in detail. She understood the risks including infection, bleeding, cervical laceration and uterine perforation.  Verbal consent was obtained.   PROCEDURE NOTE:  [place] endometrial biopsy was performed using aseptic technique with iodine preparation.  The uterus was sounded to a length of *** cm.  Adequate sampling {Actions; was/was not:31712} obtained with minimal blood loss.  The patient tolerated the procedure well.  Disposition will be pending pathology.   Results: No results found for this or any previous visit (from the past 24 hours).   Assessment/Plan: No diagnosis found.    No orders of the defined types were placed in this encounter.     No follow-ups on file.  Krystl Wickware B. Edie Darley, PA-C 12/14/2023 8:58 PM

## 2023-12-15 ENCOUNTER — Other Ambulatory Visit (HOSPITAL_COMMUNITY)
Admission: RE | Admit: 2023-12-15 | Discharge: 2023-12-15 | Disposition: A | Discharge status: Home / Self Care | Source: Ambulatory Visit | Attending: Obstetrics and Gynecology | Admitting: Obstetrics and Gynecology

## 2023-12-15 ENCOUNTER — Encounter: Payer: Self-pay | Admitting: Obstetrics and Gynecology

## 2023-12-15 ENCOUNTER — Ambulatory Visit: Admitting: Obstetrics and Gynecology

## 2023-12-15 VITALS — BP 117/74 | HR 65 | Ht 69.0 in | Wt 173.0 lb

## 2023-12-15 DIAGNOSIS — R9389 Abnormal findings on diagnostic imaging of other specified body structures: Secondary | ICD-10-CM | POA: Diagnosis not present

## 2023-12-15 DIAGNOSIS — N950 Postmenopausal bleeding: Secondary | ICD-10-CM | POA: Diagnosis not present

## 2023-12-15 DIAGNOSIS — N858 Other specified noninflammatory disorders of uterus: Secondary | ICD-10-CM | POA: Diagnosis not present

## 2023-12-15 DIAGNOSIS — Z79890 Hormone replacement therapy (postmenopausal): Secondary | ICD-10-CM

## 2023-12-15 DIAGNOSIS — N951 Menopausal and female climacteric states: Secondary | ICD-10-CM

## 2023-12-15 NOTE — Patient Instructions (Signed)
 I value your feedback and you entrusting [place] with your care. If you get a [place] patient survey, I would appreciate you taking the time to let us know about your experience today. Thank you! ? ? ?

## 2023-12-19 ENCOUNTER — Encounter (HOSPITAL_COMMUNITY): Payer: Self-pay

## 2023-12-19 ENCOUNTER — Encounter: Payer: Self-pay | Admitting: Obstetrics and Gynecology

## 2023-12-19 ENCOUNTER — Other Ambulatory Visit: Payer: Self-pay | Admitting: Medical Genetics

## 2023-12-19 LAB — SURGICAL PATHOLOGY

## 2023-12-19 MED ORDER — PROGESTERONE 200 MG PO CAPS: ORAL_CAPSULE | ORAL | 0 refills | Status: DC

## 2023-12-19 MED ORDER — ESTRADIOL 0.5 MG PO TABS: 0.5000 mg | ORAL_TABLET | Freq: Every day | ORAL | 0 refills | Status: DC

## 2023-12-19 NOTE — Addendum Note (Signed)
 Addended by: Alyn Judge B on: 12/19/2023 12:56 PM   Modules accepted: Orders

## 2023-12-20 ENCOUNTER — Other Ambulatory Visit
Admission: RE | Admit: 2023-12-20 | Discharge: 2023-12-20 | Disposition: A | Discharge status: Home / Self Care | Payer: Self-pay | Source: Ambulatory Visit | Attending: Medical Genetics | Admitting: Medical Genetics

## 2023-12-26 ENCOUNTER — Ambulatory Visit
Admission: RE | Admit: 2023-12-26 | Discharge: 2023-12-26 | Disposition: A | Discharge status: Home / Self Care | Source: Ambulatory Visit | Attending: Physician Assistant | Admitting: Physician Assistant

## 2023-12-26 DIAGNOSIS — Z1239 Encounter for other screening for malignant neoplasm of breast: Secondary | ICD-10-CM | POA: Diagnosis not present

## 2023-12-26 DIAGNOSIS — Z1231 Encounter for screening mammogram for malignant neoplasm of breast: Secondary | ICD-10-CM | POA: Diagnosis not present

## 2024-01-10 LAB — GENECONNECT MOLECULAR SCREEN: Genetic Analysis Overall Interpretation: POSITIVE — AB

## 2024-01-11 ENCOUNTER — Telehealth: Payer: Self-pay | Admitting: Medical Genetics

## 2024-01-11 DIAGNOSIS — E7801 Familial hypercholesterolemia: Secondary | ICD-10-CM

## 2024-01-11 NOTE — Telephone Encounter (Signed)
 [facility] [facility] Positive Result Note 01/11/2024 9:10 AM  FIRST ATTEMPT: Confirmed I was speaking with Jenna Holt [phone] by using name and DOB. Informed participant the reason for this call is to provide results for the above study. Results revealed Hereditary High Cholesterol or Familial Hypercholesterolemia. Genetic counseling was offered and participant requests a call back to schedule. All questions were answered, and participant was thanked for their time and support of the above study. Participant was encouraged to contact [facility] if they have any further questions or concerns.

## 2024-01-11 NOTE — Telephone Encounter (Signed)
 [facility] 01/11/2024  10:29 AM  Confirmed I was speaking with Treacy Michelle Everlyn Hockey [phone] by using name and DOB. Genetic counseling was offered and participant is scheduled. All questions were answered, and participant was thanked for their time and support of the above study. Participant was encouraged to contact [facility] if they have any further questions or concerns.    Jordyn Pennstrom, BS [place]  [facility] Clinical Research Specialist II Direct Dial: [phone]  Fax: [phone]

## 2024-01-17 ENCOUNTER — Ambulatory Visit: Admitting: Genetic Counselor

## 2024-01-17 ENCOUNTER — Encounter: Payer: Self-pay | Admitting: Genetic Counselor

## 2024-01-17 DIAGNOSIS — E7801 Familial hypercholesterolemia: Secondary | ICD-10-CM

## 2024-01-17 NOTE — Progress Notes (Signed)
 PRIMARY PROVIDER:  Darl Edu, MD  PRIMARY REASON FOR VISIT:  1. APOB-related hypercholesterolemia, autosomal dominant    Ms. Pluff Llana Rile, a 53 y.o. female, was seen for a [place] genetic counseling as follow up to her participation in the [place] Study, part of the [facility] [employer] Research Program. Ms. Jakki Doughty presents to clinic today to discuss the results of the genetic testing provided by the GeneConnect study, which did identify a pathogenic variant in the APOB gene.   RELEVANT MEDICAL HISTORY:  Ms. Tanai Bouler was first diagnosed with high cholesterol in her late 13s. She was treated with statins for many years, but felt that these medications were not effective in managing cholesterol levels. Additionally, Ms. Pluff Llana Rile reports elevated liver enzymes while being treated with statins. She is currently not on treatment for the hyperlipidemia and does report taking fish oil. She does report history of waxy deposits in the skin around her eyes. Cholesterol measured 08/2023 as part of pre-employment screening.  Lipid Panel     Component Value Date/Time   CHOL 304 (H) 09/01/2023 0818   TRIG 68 09/01/2023 0818   HDL 73 09/01/2023 0818   CHOLHDL 4.2 09/01/2023 0818   LDLCALC 221 (H) 09/01/2023 0818   LABVLDL 10 09/01/2023 0818   Ms. Pluff Llana Rile does not report a personal history of cancer. She reports menarche at age 67, had her first child at age 59, reports menopause at age 87. She has been on hormone replacement therapy since 12/2022. Mammogram completed 12/2023, density category C. No history of breast biopsy. Colonoscopy 05/2021, one flat polyp. She had a MyRisk panel thorugh [facility] in 2024, negative for pathogenic variants in the following genes: APC, ATM, AXIN2, BAP1, BARD1, BMPR1A, BRCA1, BRCA2, BRIP1, CDH1, CDK4, CDKN2A, CHEK2, CTNNA1, FH, FLCN, HOXB13, MEN1, MET, MLH1, MSH2, MSH3, MSH6, MUTYH, NTHL1, PALB2, PMS2, PTEN, RAD51C, RAD51D, SDHA, SDHB, SDHC, SDHD, SMAD4, STK11,  TP53, TSC1, TSC2, VHL, EGFR, EPCAM, GREM1, MITF, POLE, POLD1, RET, TERT.   Past Medical History:  Diagnosis Date   BRCA negative 12/2022   MyRisk neg; IBIS=11.4%/riskscore=15.1%   Family history of breast cancer in female    pat uncle; his daughter is BRCA pos; pt is BRCA NEG   High cholesterol     Past Surgical History:  Procedure Laterality Date   AUGMENTATION MAMMAPLASTY Bilateral    2013   COLONOSCOPY WITH PROPOFOL  N/A 06/05/2021   Procedure: COLONOSCOPY WITH PROPOFOL ;  Surgeon: Shane Darling, MD;  Location: [facility] ENDOSCOPY;  Service: Endoscopy;  Laterality: N/A;   HIP SURGERY Left 06/2019   SHOULDER ARTHROSCOPY      Social History   Socioeconomic History   Marital status: Married    Spouse name: Not on file   Number of children: Not on file   Years of education: Not on file   Highest education level: Not on file  Occupational History   Not on file  Tobacco Use   Smoking status: Never   Smokeless tobacco: Never  Vaping Use   Vaping status: Never Used  Substance and Sexual Activity   Alcohol use: Yes    Comment: occ   Drug use: No   Sexual activity: Yes    Birth control/protection: Post-menopausal  Other Topics Concern   Not on file  Social History Narrative   Not on file   Social Drivers of Health   Financial Resource Strain: Not on file  Food Insecurity: Not on file  Transportation Needs: Not on file  Physical Activity:  Not on file  Stress: Not on file  Social Connections: Not on file     FAMILY HISTORY:  We obtained a detailed, 3-generation family history.  Significant diagnoses are listed below: Family History  Problem Relation Age of Onset   Heart attack Mother 31 - 54   Lung cancer Father 68 - 81       smoker   Breast cancer Paternal Uncle 37 - 34   Breast cancer Cousin 36 - 65    Ms. Pluff Llana Rile is unaware of previous family history of genetic testing. There is no reported Ashkenazi Jewish ancestry. Ms. Avianah Pellman reports her mother  passed away from a heart attack in her 38s, and suspects that she had hyperlipidemia. She reports her father was diagnosed with lung cancer, smoking history, and passed away in his 65s. She reports a paternal uncle diagnosed with breast cancer in his 25s, living. She reports a paternal first cousin diagnosed with breast cancer in her 57s, deceased. She does not report additional family history of cancer, heart disease, birth defects, intellectual disability, learning disability, developmental delay, or history of recurrent miscarriage.      GENETIC TEST RESULTS:  Ms. Rei Contee participated in the [place] GeneConnect population genomic screening program. A single, heterozygous pathogenic variant was detected in the APOB gene called c.10580G>A (p.Arg3527Gln). There were no pathogenic or likely pathogenic variants detected in other genes reported on in this study.   [facility] Population Screen is a screening test that analyzes 11 genes related to hereditary breast and ovarian cancer syndrome (HBOC), Lynch syndrome, and familial hypercholesterolemia. These include APOB, BRCA1, BRCA2, EPCAM, LDLR, LDLRAP1, PCSK9, PMS2 (excluding exons 11-15), MLH1, MSH2, MSH6. This test reports pathogenic and likely pathogenic variants, but does not report on variants of unknown significance. The absence of any additional pathogenic variants is reassuring, but does not rule out a hereditary condition. There are other variants and genes associated with heart disease, hereditary cancer and other inherited conditions that were not included in this test. Please see full test report in labs, labeled "GeneConnect Molecular Screen," dated 12/20/2023.     CLINICAL INFORMATION:  Pathogenic variants in APOB, such as c.10580G>A (p.Arg3527Gln), are associated with an inherited condition called familial hypercholesterolemia (FH).   The APOB gene provides an instruction for our body to make a protein called apolipoprotein B  (ApoB), which play an important role in lipid metabolism. ApoB is a structural component of lipoproteins, including LDL and VLDL, which are important in regulating the amount of cholesterol in the blood. Pathogenic variants or mutations in APOB that impact gene function can cause a form of high cholesterol called familial hypercholesterolemia. However, not all individuals with APOB pathogenic variants will have signs of familial hypercholesterolemia.   Familial hypercholesterolemia (FH) is a genetic condition characterized by abnormally high concentrations of low density lipoprotein cholesterol (LDL-C) in the blood since birth. Build up of cholesterol in the body can lead to plaques, which can lead to arteries that bring blood to the heart to become narrowed or blocked. FH increase the risk for premature coronary heart disease (CHD), stroke, and death. In addition to extreme hypercholesterolemia, affected individuals may present with vascular-related manifestations of CHD, such as angina, myocardial infarction, peripheral vascular disease, and stroke. Physical manifestations, including xanthomas (subcutaneous fat deposits), xanthelasmas (subcutaneous fat deposits around the eyes), and corneal arcus (whitish ring around the iris), may also be observed.   Total cholesterol concentrations in heterozygous FH patients (genetic defect inherited from  one parent) are typically in the range of 300 to 550 mg/dL (but sometimes lower) and in homozygotes (genetic defects inherited from both parents) range from 650 to 1000 mg/dL.  Individuals with FH have a significantly increased risk for CHD, estimated to be 20 times greater than that of the general population. If untreated, men with FH have a 50% risk and women with FH have a 30% risk of having a cardiovascular event by ages 21 and 60 years, respectively. The prevalence of FH is estimated to be 1:300 to 1:500 individuals in the general population, with an even higher  prevalence in certain ethnic groups.   Heterozygous FH (HeFH) is inherited in an autosomal dominant manner. Almost all affected individuals inherit the familial mutation from an affected parent. In addition, all children of an individual with HeFH have a 50% chance of inheriting the mutation. Homozygous FH (HoFH) is rare (prevalence 1:1,000,000) and occurs when an individual inherits two deleterious mutations. Thus, if both parents have HeFH, each of their children would have a 25% risk of inheriting HoFH and a 50% risk of inheriting HeFH.  A diagnosis of FH is typically made on a clinical basis. There are several sets of validated diagnostic criteria available, including the [place]  MEDPED Program, the [place] FH Registry, and the [employer]. These criteria take into account a combination of factors, including elevated, untreated LDL-C levels (cut points vary with age), clinical history of premature CHD, physical findings, and family history of elevated LDL-C levels and/or premature CHD, as well as genetic test results. Genetic testing is clinically available and may not be needed for patient identification or management, though this information may be helpful in certain situations, specifically in identifying at risk relatives.   Management guidelines for individuals with FH are available through multiple professional organizations and medical societies, including the [employer] Lipid Association Expert Panel on Familial Hypercholesterolemia. Almost all individuals with FH will need lifelong treatment to lower LDL cholesterol levels, ideally by at least 50%. Treatment typically involves intensive pharmacologic therapy, with statins as the first drug of choice, often in combination with other lipid-lowering drugs.  In addition, lifestyle modifications to address cardiovascular risk factors are recommended for all FH patients. Individuals with FH may also benefit from referral to a lipid  specialist, particularly if treatment goals are not being met with maximal medical therapy.    IMPLICATIONS FOR FAMILY MEMBERS: Individuals with FH are encouraged to share information about their diagnosis with at-risk family members. A family letter will be provided to help share this result with relatives. "Cascade screening" is a systematic process through which additional family members with FH are identified, starting with all first degree relatives (parents, siblings, and children) of an FH patient. Lipid or molecular analysis can be used to screen family members. Since all first degree relatives of affected individuals have a 50% risk of also having FH, cascade screening is a high-yield and cost-effective way of identifying previously undiagnosed FH patients.   Evaluation of relatives for familial hypercholesterolemia should include minors (individuals under the age of 38). The recommendation from the National Lipid Association's Expert Panel on FH is that cholesterol screening should be considered beginning at age 16 for children with a family history of premature CHD or elevated cholesterol.  If family members have FH they require immediate initiation of treatment and management of other CHD risk factors (e.g. hypertension, diabetes, smoking) since individuals with FH have a very high lifetime risk of CHD and  are at high risk of premature CHD.   ADDITIONAL TESTING AND RECOMMENDATIONS: Ms. Khiley Lieser does meet NCCN BOPP criteria for comprehensive genetic testing for hereditary cancer. She had a negative MyRisk panel (covered 48 genes: APC, ATM, AXIN2, BAP1, BARD1, BMPR1A, BRCA1, BRCA2, BRIP1, CDH1, CDK4, CDKN2A, CHEK2, CTNNA1, FH, FLCN, HOXB13, MEN1, MET, MLH1, MSH2, MSH3, MSH6, MUTYH, NTHL1, PALB2, PMS2, PTEN, RAD51C, RAD51D, SDHA, SDHB, SDHC, SDHD, SMAD4, STK11, TP53, TSC1, TSC2, VHL, EGFR, EPCAM, GREM1, MITF, POLE, POLD1, RET, TERT) completed in 2024. No additional testing recommended at this  time. Would encourage her paternal uncle, diagnosed with breast cancer in his 60s, to have genetic testing for inherited cancer.   The Tyrer-Cuzick model is one of multiple prediction models developed to estimate an individual's lifetime risk of developing breast cancer. The Tyrer-Cuzick model is endorsed by the [employer] (NCCN). This model includes many risk factors such as family history, endogenous estrogen exposure, and benign breast disease. The calculation is highly-dependent on the accuracy of clinical data provided by the patient and can change over time. The Tyrer-Cuzick model may be repeated to reflect new information in her personal or family history in the future.   Based on Ms. Pluff Gioia's history, a Barton Bos was used to estimate her risk of developing breast cancer. This estimates her lifetime risk of developing breast cancer to be approximately 13.1%. NCCN recommends consideration of breast MRI screening as an adjunct to mammography for patients at high risk (defined as 20% or greater lifetime risk). Please note that a woman's breast cancer risk changes over time. It may increase or decrease based on age and any changes to the personal and/or family medical history. The risks and recommendations listed above apply to this patient at this point in time. In the future, she may or may not be eligible for the same medical management strategies and, in some cases, other medical management strategies may become available to her. If she is interested in an updated breast cancer risk assessment at a later date, she can contact us .    RESOURCES: Family Heart Foundation: [employer]co.uk  National Lipid Association: https://www.learnyourlipids.com/  PLAN:  Recommend consultation with [facility] lipidologist, Dr. Maximo Spar, in the [facility]. Referral has already been placed.  Patient awaiting call back to schedule. Provided with clinic number if needed -  [phone].   Results will be shared with Ms. Pluff Gioia's primary care provider, Darl Edu, MD, for further management.   Genetic test results should be shared with relatives, who can consider undergoing genetic testing for the APOB c.10580G>A (p.Arg3527Gln) pathogenic variant and monitoring of cholesterol.   Lastly, we encouraged Ms. Pluff Gioia to remain in contact with [facility]'s genetics team so that we can continuously update the family history and inform her of any changes in our understanding of familial hypercholesterolemia and any additional genetic testing that may be of benefit for this family.   Ms. Teressa Ferdinand Gioia's questions were answered to her satisfaction today. Our contact information was provided should additional questions or concerns arise. Thank you for the referral and allowing us  to share in the care of your patient.   Jobie Mulders, MS, [facility] Licensed, Retail [email or web address] phone: [phone]  65 minutes were spent on the date of the encounter in service to the patient including preparation, face-to-face consultation, documentation and care coordination.  The patient was seen alone.    I connected with  Ms. Pluff Gioia on 01/17/2024  at 8:30 am EDT by telephone and verified that I am speaking with the correct person using two identifiers.   Patient location: [place] Provider location: [place]  __________________________________________________________________ For Office Staff:  Number of people involved in session: 1 Was an Intern/ student involved with case: no

## 2024-01-19 ENCOUNTER — Encounter: Payer: Self-pay | Admitting: Dermatology

## 2024-01-19 ENCOUNTER — Ambulatory Visit: Admitting: Dermatology

## 2024-01-19 DIAGNOSIS — D239 Other benign neoplasm of skin, unspecified: Secondary | ICD-10-CM

## 2024-01-19 DIAGNOSIS — L209 Atopic dermatitis, unspecified: Secondary | ICD-10-CM

## 2024-01-19 DIAGNOSIS — Z1283 Encounter for screening for malignant neoplasm of skin: Secondary | ICD-10-CM | POA: Diagnosis not present

## 2024-01-19 DIAGNOSIS — L578 Other skin changes due to chronic exposure to nonionizing radiation: Secondary | ICD-10-CM

## 2024-01-19 DIAGNOSIS — D229 Melanocytic nevi, unspecified: Secondary | ICD-10-CM

## 2024-01-19 DIAGNOSIS — D2372 Other benign neoplasm of skin of left lower limb, including hip: Secondary | ICD-10-CM

## 2024-01-19 DIAGNOSIS — L814 Other melanin hyperpigmentation: Secondary | ICD-10-CM | POA: Diagnosis not present

## 2024-01-19 DIAGNOSIS — L821 Other seborrheic keratosis: Secondary | ICD-10-CM | POA: Diagnosis not present

## 2024-01-19 DIAGNOSIS — D1801 Hemangioma of skin and subcutaneous tissue: Secondary | ICD-10-CM

## 2024-01-19 DIAGNOSIS — W908XXA Exposure to other nonionizing radiation, initial encounter: Secondary | ICD-10-CM

## 2024-01-19 MED ORDER — CLOBETASOL PROPIONATE 0.05 % EX CREA: 1.0000 | TOPICAL_CREAM | CUTANEOUS | 1 refills | Status: AC

## 2024-01-19 NOTE — Patient Instructions (Addendum)
 Seborrheic Keratosis  What causes seborrheic keratoses? Seborrheic keratoses are harmless, common skin growths that first appear during adult life.  As time goes by, more growths appear.  Some people may develop a large number of them.  Seborrheic keratoses appear on both covered and uncovered body parts.  They are not caused by sunlight.  The tendency to develop seborrheic keratoses can be inherited.  They vary in color from skin-colored to gray, brown, or even black.  They can be either smooth or have a rough, warty surface.   Seborrheic keratoses are superficial and look as if they were stuck on the skin.  Under the microscope this type of keratosis looks like layers upon layers of skin.  That is why at times the top layer may seem to fall off, but the rest of the growth remains and re-grows.    Treatment Seborrheic keratoses do not need to be treated, but can easily be removed in the office.  Seborrheic keratoses often cause symptoms when they rub on clothing or jewelry.  Lesions can be in the way of shaving.  If they become inflamed, they can cause itching, soreness, or burning.  Removal of a seborrheic keratosis can be accomplished by freezing, burning, or surgery. If any spot bleeds, scabs, or grows rapidly, please return to have it checked, as these can be an indication of a skin cancer.  Dermatofibroma left medial proximal thigh A dermatofibroma is a benign growth possibly related to trauma, such as an insect bite, cut from shaving, or inflamed acne-type bump.  Treatment options to remove include shave or excision with resulting scar and risk of recurrence.  Since benign-appearing and not bothersome, will observe for now.    Due to recent changes in healthcare laws, you may see results of your pathology and/or laboratory studies on MyChart before the doctors have had a chance to review them. We understand that in some cases there may be results that are confusing or concerning to you. Please  understand that not all results are received at the same time and often the doctors may need to interpret multiple results in order to provide you with the best plan of care or course of treatment. Therefore, we ask that you please give us  2 business days to thoroughly review all your results before contacting the office for clarification. Should we see a critical lab result, you will be contacted sooner.   If You Need Anything After Your Visit  If you have any questions or concerns for your doctor, please call our main line at [phone] and press option 4 to reach your doctor's medical assistant. If no one answers, please leave a voicemail as directed and we will return your call as soon as possible. Messages left after 4 pm will be answered the following business day.   You may also send us  a message via MyChart. We typically respond to MyChart messages within 1-2 business days.  For prescription refills, please ask your pharmacy to contact our office. Our fax number is [phone].  If you have an urgent issue when the clinic is closed that cannot wait until the next business day, you can page your doctor at the number below.    Please note that while we do our best to be available for urgent issues outside of office hours, we are not available 24/7.   If you have an urgent issue and are unable to reach us , you may choose to seek medical care at your doctor's office,  retail clinic, urgent care center, or emergency room.  If you have a medical emergency, please immediately call 911 or go to the emergency department.  Pager Numbers  - Dr. Bary Likes: [phone]  - Dr. Annette Barters: [phone]  - Dr. Felipe Horton: [phone]   In the event of inclement weather, please call our main line at [phone] for an update on the status of any delays or closures.  Dermatology Medication Tips: Please keep the boxes that topical medications come in in order to help keep track of the instructions  about where and how to use these. Pharmacies typically print the medication instructions only on the boxes and not directly on the medication tubes.   If your medication is too expensive, please contact our office at [phone] option 4 or send us  a message through MyChart.   We are unable to tell what your co-pay for medications will be in advance as this is different depending on your insurance coverage. However, we may be able to find a substitute medication at lower cost or fill out paperwork to get insurance to cover a needed medication.   If a prior authorization is required to get your medication covered by your insurance company, please allow us  1-2 business days to complete this process.  Drug prices often vary depending on where the prescription is filled and some pharmacies may offer cheaper prices.  The website www.goodrx.com contains coupons for medications through different pharmacies. The prices here do not account for what the cost may be with help from insurance (it may be cheaper with your insurance), but the website can give you the price if you did not use any insurance.  - You can print the associated coupon and take it with your prescription to the pharmacy.  - You may also stop by our office during regular business hours and pick up a GoodRx coupon card.  - If you need your prescription sent electronically to a different pharmacy, notify our office through [facility] or by phone at [phone] option 4.     Si Usted Necesita Algo Despus de Su Visita  Tambin puede enviarnos un mensaje a travs de Clinical cytogeneticist. Por lo general respondemos a los mensajes de MyChart en el transcurso de 1 a 2 das hbiles.  Para renovar recetas, por favor pida a su farmacia que se ponga en contacto con nuestra oficina. Franz Jacks de fax es [place] [phone].  Si tiene un asunto urgente cuando la clnica est cerrada y que no puede esperar hasta el siguiente da hbil, puede  llamar/localizar a su doctor(a) al nmero que aparece a continuacin.   Por favor, tenga en cuenta que aunque hacemos todo lo posible para estar disponibles para asuntos urgentes fuera del horario de [place], no estamos disponibles las 24 horas del da, los 7 [address]  [address] de la [place].   Si tiene un problema urgente y no puede comunicarse con nosotros, puede optar por buscar atencin mdica  en el consultorio de su doctor(a), en una clnica privada, en un centro de atencin urgente o en una sala de emergencias.  Si tiene Engineer, drilling, por favor llame inmediatamente al 911 o vaya a la sala de emergencias.  Nmeros de bper  - Dr. Bary Likes: [phone]  - Dra. Annette Barters: [phone]  - Dr. Felipe Horton: [phone]   En caso de inclemencias del tiempo, por favor llame a Lajuan Pila principal al [phone] para una actualizacin sobre el [place] de cualquier retraso o cierre.  Consejos para la medicacin en dermatologa: Por  favor, guarde las cajas en las que vienen los medicamentos de uso tpico para ayudarle a seguir las instrucciones sobre dnde y cmo usarlos. Las farmacias generalmente imprimen las instrucciones del medicamento slo en las cajas y no directamente en los tubos del [place].   Si su medicamento es muy caro, por favor, pngase en contacto con Bettyjane Brunet llamando al [phone] y presione la opcin 4 o envenos un mensaje a travs de Clinical cytogeneticist.   No podemos decirle cul ser su copago por los medicamentos por adelantado ya que esto es diferente dependiendo de la cobertura de su seguro. Sin embargo, es posible que podamos encontrar un medicamento sustituto a Audiological scientist un formulario para que el seguro cubra el medicamento que se considera necesario.   Si se requiere una autorizacin previa para que su compaa de seguros [place] su medicamento, por favor permtanos de 1 a 2 das hbiles para completar este proceso.  Los precios de los medicamentos varan con  frecuencia dependiendo del Environmental consultant de dnde se surte la receta y alguna farmacias pueden ofrecer precios ms baratos.  El sitio web www.goodrx.com tiene cupones para medicamentos de Health and safety inspector. Los precios aqu no tienen en cuenta lo que podra costar con la ayuda del seguro (puede ser ms barato con su seguro), pero el sitio web puede darle el precio si no utiliz Tourist information centre manager.  - Puede imprimir el cupn correspondiente y llevarlo con su receta a la farmacia.  - Tambin puede pasar por nuestra oficina durante el horario de atencin regular y Education officer, museum una tarjeta de cupones de GoodRx.  - Si necesita que su receta se enve electrnicamente a una farmacia diferente, informe a nuestra oficina a travs de MyChart de [place] o por telfono llamando al [phone] y presione la opcin 4.

## 2024-01-19 NOTE — Progress Notes (Addendum)
 Follow-Up Visit   Subjective  Jenna Holt is a 53 y.o. female who presents for the following: Skin Cancer Screening and Full Body Skin Exam, no hx of skin ca, patient states her father may have had skin cancer but not sure, hand derm vs other, flared x 47m, using Eucrisa  oint bid, tried her Ciclopirox  cr did not help  The patient presents for Total-Body Skin Exam (TBSE) for skin cancer screening and mole check. The patient has spots, moles and lesions to be evaluated, some may be new or changing and the patient may have concern these could be cancer.     The following portions of the chart were reviewed this encounter and updated as appropriate: medications, allergies, medical history  Review of Systems:  No other skin or systemic complaints except as noted in HPI or Assessment and Plan.  Objective  Well appearing patient in no apparent distress; mood and affect are within normal limits.  A full examination was performed including scalp, head, eyes, ears, nose, lips, neck, chest, axillae, abdomen, back, buttocks, bilateral upper extremities, bilateral lower extremities, hands, feet, fingers, toes, fingernails, and toenails. All findings within normal limits unless otherwise noted below.  Exam limited by tattoos Exam of nails limited by presence of nail polish.  Relevant physical exam findings are noted in the Assessment and Plan.    Assessment & Plan   SKIN CANCER SCREENING PERFORMED TODAY.  ACTINIC DAMAGE - Chronic condition, secondary to cumulative UV/sun exposure - diffuse scaly erythematous macules with underlying dyspigmentation - Recommend daily broad spectrum sunscreen SPF 30+ to sun-exposed areas, reapply every 2 hours as needed.  - Staying in the shade or wearing long sleeves, sun glasses (UVA+UVB protection) and wide brim hats (4-inch brim around the entire circumference of the hat) are also recommended for sun protection.  - Call for new or changing  lesions.  LENTIGINES, SEBORRHEIC KERATOSES, HEMANGIOMAS - Benign normal skin lesions - Benign-appearing - Call for any changes  MELANOCYTIC NEVI - Tan-brown and/or pink-flesh-colored symmetric macules and papules - Benign appearing on exam today - Observation - Call clinic for new or changing moles - Recommend daily use of broad spectrum spf 30+ sunscreen to sun-exposed areas.   ECZEMA Hands Exam: Scaly pink papules coalescing to plaques palms, wrist. 3% BSA. Previously on face  Chronic and persistent condition with duration or expected duration over one year. Condition is bothersome/symptomatic for patient. Currently flared.  Atopic dermatitis (eczema) is a chronic, relapsing, pruritic condition that can significantly affect quality of life. It is often associated with allergic rhinitis and/or asthma and can require treatment with topical medications, phototherapy, or in severe cases biologic injectable medication (Dupixent; Adbry) or Oral JAK inhibitors.   Treatment Plan: Start Clobetasol cr bid until smooth then d/c, avoid f/g/a Discussed opzelura and Dupixent if not improving with topical steroid. Patient will send update on mychart  Topical steroids (such as triamcinolone, fluocinolone, fluocinonide, mometasone, clobetasol, halobetasol, betamethasone , hydrocortisone ) can cause thinning and lightening of the skin if they are used for too long in the same area. Your physician has selected the right strength medicine for your problem and area affected on the body. Please use your medication only as directed by your physician to prevent side effects.    DERMATOFIBROMA L medial proximal thigh Exam: Firm pink/brown papulenodule with dimple sign. Treatment Plan: A dermatofibroma is a benign growth possibly related to trauma, such as an insect bite, cut from shaving, or inflamed acne-type bump.  Treatment options  to remove include shave or excision with resulting scar and risk of  recurrence.  Since benign-appearing and not bothersome, will observe for now.    ATOPIC DERMATITIS, UNSPECIFIED TYPE   MULTIPLE BENIGN NEVI   LENTIGINES   ACTINIC ELASTOSIS   SEBORRHEIC KERATOSES   CHERRY ANGIOMA   DERMATOFIBROMA   Return in about 1 year (around 01/18/2025) for TBSE.  I, Rollie Clipper, RMA, am acting as scribe for Harris Liming, MD .   Documentation: I have reviewed the above documentation for accuracy and completeness, and I agree with the above.  Harris Liming, MD

## 2024-02-01 ENCOUNTER — Encounter: Payer: Self-pay | Admitting: Obstetrics and Gynecology

## 2024-02-16 NOTE — Progress Notes (Signed)
 GYNECOLOGY PROGRESS NOTE  Subjective:  PCP: No primary care provider on file.  Patient ID: Jenna Holt, female    DOB: March 31, 1971, 53 y.o.   MRN: [id number]  HPI  Patient is a 53 y.o. G76P1001 female who presents for consult for AUB, has been seeing Bernarda Schroeder, PA, last on 5/8.  Pt post-menopausal with LMP approx 07/2021 without PMB. Began HRT 12/2022 with Alicia for vasomotor symptoms, Estradiol  0.5mg /Prometrium  100mg . She began spotting around 07/2023 and since that time, continues to have irregular light bleeding/spotting intermittently without pattern. Can sometimes go for up to 1 month without, but not much longer. Was initially thought to be due to HRT, so throughout this time, her Prometrium  has been gradually increased and she is currently on 200mg  daily. US  done 12/02/23 and showed concern for possible hyperplasia vs carcinoma, as well as a 9mm intramural fibroid. EMB done 12/15/23, negative.   The following portions of the patient's history were reviewed and updated as appropriate: allergies, current medications, past family history, past medical history, past social history, past surgical history, and problem list.  Review of Systems Pertinent items are noted in HPI.   Objective:   Blood pressure 104/72, pulse 75, height 5' 9 (1.753 m), weight 169 lb (76.7 kg), last menstrual period 07/19/2023. Body mass index is 24.96 kg/m.  General appearance: alert and cooperative Abdomen: soft, non-tender; bowel sounds normal; no masses,  no organomegaly Pelvic: cervix normal in appearance, external genitalia normal, no adnexal masses or tenderness, no cervical motion tenderness, rectovaginal septum normal, uterus normal size, shape, and consistency, and vagina normal without discharge Extremities: extremities normal, atraumatic, no cyanosis or edema Neurologic: Grossly normal  12/15/23 PATHOLOGY Clinical History: PMB, thickened endometrium (cm)  FINAL MICROSCOPIC DIAGNOSIS:    A. ENDOMETRIUM, BIOPSY:  Inactive endometrium.  Negative for endometrial intraepithelial neoplasia (EIN) and malignancy.   12/02/23 TRANSABDOMINAL AND TRANSVAGINAL ULTRASOUND OF PELVIS FINDINGS: Uterus   Measurements: 7.8 cm in sagittal dimension. Intramural ovoid hypoechogenicity located within the anterior lower uterine segment measures 0.9 x 0.7 x 0.7 cm.   Endometrium   Thickness: 16 mm. Multifocal subcentimeter cysts with areas of increased focal vascularity.   Right ovary   Measurements: 1.5 x 1.4 x 0.9 cm = volume: 0.9 mL. Normal appearance. No adnexal mass.   Left ovary   Measurements: 1.7 x 1.5 x 0.8 cm = volume: 1.1 mL. Normal appearance. No adnexal mass.   Other findings:  No abnormal free fluid.   IMPRESSION: 1. Endometrial thickness of 16 mm with multifocal subcentimeter cysts and areas of increased focal vascularity. Findings are suspicious for endometrial hyperplasia or carcinoma. 2. Intramural ovoid hypoechogenicity located within the anterior lower uterine segment measures 0.9 cm, likely a small leiomyoma if there has been no history of prior cesarean section.    Assessment/Plan:   1. Post-menopausal bleeding   2. Thickened endometrium   3. Fibroid     53 y.o. G1P1001 with PMB since 07/2023, initially attributed to HRT, US  showing 9mm fibroid and EMT 16mm with concern for possible hyperplasia, subsequent negative EMBx. Today we discussed doing HSC with D&C, as as we were discussing, she actually desires hysterectomy as definitive treatment. She participates in road races and has some coming up, as well as vacations; we briefly discussed recovery and time off from activities, would like surgery on Nov. 10th if possible. Understands she will need to be seen for pre-op and will be in town the week of Oct 20th and available.  May continue current medications at this time, offered alternatives to help with her irregular bleeding, declines.  -Requesting  surgery on Mon. Nov 10th -Follow up for pre-op visit with Dr. Janit week of Oct 20th  Total time was 32 minutes. That includes chart review before the visit, the actual patient visit, and time spent on documentation after the visit. Time excludes procedures, if any.    Estil Mangle, DO [facility]

## 2024-02-17 ENCOUNTER — Ambulatory Visit: Admitting: Obstetrics

## 2024-02-17 ENCOUNTER — Encounter: Payer: Self-pay | Admitting: Obstetrics

## 2024-02-17 VITALS — BP 104/72 | HR 75 | Ht 69.0 in | Wt 169.0 lb

## 2024-02-17 DIAGNOSIS — D219 Benign neoplasm of connective and other soft tissue, unspecified: Secondary | ICD-10-CM

## 2024-02-17 DIAGNOSIS — D259 Leiomyoma of uterus, unspecified: Secondary | ICD-10-CM | POA: Diagnosis not present

## 2024-02-17 DIAGNOSIS — N950 Postmenopausal bleeding: Secondary | ICD-10-CM

## 2024-02-17 DIAGNOSIS — R9389 Abnormal findings on diagnostic imaging of other specified body structures: Secondary | ICD-10-CM

## 2024-02-17 DIAGNOSIS — N939 Abnormal uterine and vaginal bleeding, unspecified: Secondary | ICD-10-CM | POA: Insufficient documentation

## 2024-02-17 HISTORY — DX: Abnormal findings on diagnostic imaging of other specified body structures: R93.89

## 2024-02-17 HISTORY — DX: Benign neoplasm of connective and other soft tissue, unspecified: D21.9

## 2024-03-15 ENCOUNTER — Other Ambulatory Visit: Payer: Self-pay | Admitting: Obstetrics and Gynecology

## 2024-03-15 DIAGNOSIS — N951 Menopausal and female climacteric states: Secondary | ICD-10-CM

## 2024-03-15 DIAGNOSIS — Z79890 Hormone replacement therapy (postmenopausal): Secondary | ICD-10-CM

## 2024-03-15 NOTE — Telephone Encounter (Signed)
 Pt to continue meds until pre op in October

## 2024-03-20 ENCOUNTER — Ambulatory Visit
Admission: RE | Admit: 2024-03-20 | Discharge: 2024-03-20 | Disposition: A | Discharge status: Home / Self Care | Attending: Physician Assistant | Admitting: Physician Assistant

## 2024-03-20 ENCOUNTER — Encounter: Payer: Self-pay | Admitting: Physician Assistant

## 2024-03-20 ENCOUNTER — Ambulatory Visit: Payer: Self-pay | Admitting: Physician Assistant

## 2024-03-20 ENCOUNTER — Ambulatory Visit
Admission: RE | Admit: 2024-03-20 | Discharge: 2024-03-20 | Disposition: A | Discharge status: Home / Self Care | Source: Ambulatory Visit | Attending: Physician Assistant | Admitting: Physician Assistant

## 2024-03-20 VITALS — BP 124/76 | HR 65 | Temp 97.9°F | Resp 14

## 2024-03-20 DIAGNOSIS — M25571 Pain in right ankle and joints of right foot: Secondary | ICD-10-CM | POA: Insufficient documentation

## 2024-03-20 DIAGNOSIS — R600 Localized edema: Secondary | ICD-10-CM | POA: Diagnosis not present

## 2024-03-20 DIAGNOSIS — S93401A Sprain of unspecified ligament of right ankle, initial encounter: Secondary | ICD-10-CM | POA: Diagnosis not present

## 2024-03-20 DIAGNOSIS — M7989 Other specified soft tissue disorders: Secondary | ICD-10-CM | POA: Diagnosis not present

## 2024-03-20 NOTE — Progress Notes (Addendum)
 Reports stumbling down several steps this am while catching herself and has some pain to left top of foot area.  No edema noted.  Reports pain approximately level 3 with movement and dull ache pointing right foot medial and pain is located by her at middle top of right foot.  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  Ambulated into clinic unassisted.

## 2024-03-20 NOTE — Progress Notes (Signed)
   Subjective: Right ankle pain    Patient ID: Jenna Holt, female    DOB: 11/20/1970, 53 y.o.   MRN: [id number]  HPI Patient presents with right ankle pain and edema secondary to a slip and fall earlier this morning.  Patient states pain with ambulation.  Denies loss of sensation.  Voices concern secondary to 5K run next week.   Review of Systems AUB, ADD, constipation, and insomnia.    Objective:   Physical Exam BP 124/76  Pulse Rate 65  Temp 97.9 F (36.6 C)  Resp 14  SpO2 95 %  Ambulates atypical gait. Examination of right ankle shows no obvious deformity.  Patient has moderate edema to the left lateral ankle.  No ecchymosis or erythema.  Patient has full and equal range of left flexion, extension, and lateral movements.  Patient has moderate guarding with medial movements of the ankle.       Assessment & Plan: Right ankle sprain   Due to the patient upcoming running event we will x-ray to ankle.  Patient advised conservative measures pending x-ray report.

## 2024-03-21 ENCOUNTER — Encounter: Payer: Self-pay | Admitting: Family Medicine

## 2024-03-21 ENCOUNTER — Ambulatory Visit: Payer: Self-pay | Admitting: Physician Assistant

## 2024-03-21 ENCOUNTER — Encounter: Payer: Self-pay | Admitting: Obstetrics

## 2024-03-21 ENCOUNTER — Ambulatory Visit: Admitting: Family Medicine

## 2024-03-21 VITALS — BP 122/83 | HR 64 | Temp 98.5°F | Ht 69.0 in | Wt 174.2 lb

## 2024-03-21 DIAGNOSIS — Z23 Encounter for immunization: Secondary | ICD-10-CM

## 2024-03-21 DIAGNOSIS — R2230 Localized swelling, mass and lump, unspecified upper limb: Secondary | ICD-10-CM

## 2024-03-21 DIAGNOSIS — E7801 Familial hypercholesterolemia: Secondary | ICD-10-CM

## 2024-03-21 DIAGNOSIS — R0981 Nasal congestion: Secondary | ICD-10-CM

## 2024-03-21 MED ORDER — CYCLOBENZAPRINE HCL 10 MG PO TABS: 10.0000 mg | ORAL_TABLET | Freq: Every evening | ORAL | 3 refills | Status: AC | PRN

## 2024-03-21 NOTE — Assessment & Plan Note (Signed)
 Has appointment with lipid specialist in about a month. Likely would benefit from repatha. Call with any concerns.

## 2024-03-21 NOTE — Progress Notes (Signed)
 BP 122/83   Pulse 64   Temp 98.5 F (36.9 C) (Oral)   Ht 5' 9 (1.753 m)   Wt 174 lb 3.2 oz (79 kg)   LMP 07/19/2023 (Approximate)   SpO2 97%   BMI 25.72 kg/m    Subjective:    Patient ID: Jenna Holt, female    DOB: 1971-03-02, 53 y.o.   MRN: [id number]  HPI: Jenna Holt is a 53 y.o. female  Chief Complaint  Patient presents with   Establish Care   Sinusitis    Does have mold in the building at work.    Shoulder Pain    Mass on right shoulder. Noticed about 6 months ago. Achy.    HYPERLIPIDEMIA Hyperlipidemia status: familial- not on any medicine right now Satisfied with current treatment?  no Side effects:  no Medication compliance: not currently on anything Past cholesterol meds: simvastatin , crestor  Supplements: none Aspirin:  no The 10-year ASCVD risk score (Arnett DK, et al., 2019) is: 2.1%   Values used to calculate the score:     Age: 67 years     Clincally relevant sex: Female     Is Non-Hispanic African American: No     Diabetic: No     Tobacco smoker: No     Systolic Blood Pressure: 122 mmHg     Is BP treated: No     HDL Cholesterol: 73 mg/dL     Total Cholesterol: 304 mg/dL Chest pain:  no Coronary artery disease:  no  Has always had issues being able to breath through her nose especially with exercising. She has been having issues with post nasal drip. She gets a lot worse when she is in her work building that has mold. She has used flonase in the past which does help. She has used the neti-pot in the past with good results.   LUMP Duration: 3-4 months Location: L shoulder Onset: sudden Painful: no Discomfort: yes Status:  not changing Trauma: no Redness: no Bruising: no Recent infection: no Swollen lymph nodes: no Requesting removal: no History of cancer: no History of the same: no Associated signs and symptoms: none   Active Ambulatory Problems    Diagnosis Date Noted   Chronic left hip pain  08/23/2017   ADD (attention deficit disorder) 05/15/2019   Allergy 05/15/2019   Insomnia 11/05/2013   APOB-related hypercholesterolemia, autosomal dominant 05/15/2019   Slow transit constipation 01/03/2019   Hx of cold sores 10/26/2021   Family history of breast cancer 12/27/2022   Resolved Ambulatory Problems    Diagnosis Date Noted   Elevated BP without diagnosis of hypertension 04/12/2019   Colon cancer screening 01/03/2019   Thickened endometrium 02/17/2024   Abnormal uterine bleeding (AUB) 02/17/2024   Fibroid 02/17/2024   Past Medical History:  Diagnosis Date   Anxiety 2007   BRCA negative 12/2022   Depression 2007   Family history of breast cancer in female    High cholesterol    Past Surgical History:  Procedure Laterality Date   AUGMENTATION MAMMAPLASTY Bilateral    2013   COLONOSCOPY WITH PROPOFOL  N/A 06/05/2021   Procedure: COLONOSCOPY WITH PROPOFOL ;  Surgeon: Maryruth Ole DASEN, MD;  Location: [facility] ENDOSCOPY;  Service: Endoscopy;  Laterality: N/A;   EYE SURGERY  2000   HIP SURGERY Left 06/2019   JOINT REPLACEMENT  2020   SHOULDER ARTHROSCOPY     Outpatient Encounter Medications as of 03/21/2024  Medication Sig Note   acyclovir  ointment (ZOVIRAX ) 5 %  Apply 1 application. topically every 3 (three) hours. (Patient taking differently: Apply 1 application  topically every 3 (three) hours. PRN) 08/16/2023: Uses prn   Ascorbic Acid (VITAMIN C) 1000 MG tablet Take 1,000 mg by mouth daily.    clobetasol  cream (TEMOVATE ) 0.05 % Apply 1 Application topically as directed. Bid to aa hands until smooth, then d/c, may use prn flares, avoid face, groin, axilla (Patient taking differently: Apply 1 Application topically as directed. PRN Bid to aa hands until smooth, then d/c, may use prn flares, avoid face, groin, axilla)    estradiol  (ESTRACE ) 0.5 MG tablet TAKE 1 TABLET BY MOUTH EVERY DAY    Misc Natural Products (YUMVS BEET ROOT-TART CHERRY) 250-0.5 MG CHEW Chew by mouth.     Multiple Vitamins-Minerals (WOMENS MULTIVITAMIN PO) Take 1 tablet by mouth daily.    progesterone  (PROMETRIUM ) 200 MG capsule TAKE 1 CAPS NIGHTLY, 6 NIGHTS ON, 1 NIGHT OFF    psyllium (METAMUCIL) 58.6 % packet Take 1 packet by mouth daily.    Vitamin D-Vitamin K (VITAMIN K2-VITAMIN D3 PO) Take 5,000 Int'l Units/day by mouth daily.    zinc gluconate 50 MG tablet Take 50 mg by mouth daily.    [DISCONTINUED] cyclobenzaprine  (FLEXERIL ) 10 MG tablet Take 1 tablet by mouth as needed for muscle spasms. 08/16/2023: Uses prn   cyclobenzaprine  (FLEXERIL ) 10 MG tablet Take 1 tablet (10 mg total) by mouth at bedtime as needed for muscle spasms.    No facility-administered encounter medications on file as of 03/21/2024.   Allergies  Allergen Reactions   Latex Dermatitis    If worn   Penicillins Hives   Naproxen  Hives, Itching and Rash   Social History   Socioeconomic History   Marital status: Married    Spouse name: Not on file   Number of children: Not on file   Years of education: Not on file   Highest education level: Associate degree: academic program  Occupational History   Not on file  Tobacco Use   Smoking status: Never   Smokeless tobacco: Never  Vaping Use   Vaping status: Never Used  Substance and Sexual Activity   Alcohol use: Yes    Comment: occ   Drug use: No   Sexual activity: Yes    Birth control/protection: Post-menopausal  Other Topics Concern   Not on file  Social History Narrative   Not on file   Social Drivers of Health   Financial Resource Strain: Low Risk  (03/17/2024)   Overall Financial Resource Strain (CARDIA)    Difficulty of Paying Living Expenses: Not hard at all  Food Insecurity: No Food Insecurity (03/17/2024)   Hunger Vital Sign    Worried About Running Out of Food in the Last Year: Never true    Ran Out of Food in the Last Year: Never true  Transportation Needs: No Transportation Needs (03/17/2024)   PRAPARE - Administrator, Civil Service  (Medical): No    Lack of Transportation (Non-Medical): No  Physical Activity: Sufficiently Active (03/17/2024)   Exercise Vital Sign    Days of Exercise per Week: 4 days    Minutes of Exercise per Session: 40 min  Stress: No Stress Concern Present (03/17/2024)   [employer] of Occupational Health - Occupational Stress Questionnaire    Feeling of Stress: Only a little  Social Connections: Socially Isolated (03/17/2024)   Social Connection and Isolation Panel    Frequency of Communication with Friends and Family: Once a week  Frequency of Social Gatherings with Friends and Family: Once a week    Attends Religious Services: Never    Database administrator or Organizations: No    Attends Engineer, structural: Not on file    Marital Status: Married   Family History  Problem Relation Age of Onset   Heart attack Mother 58 - 80   Early death Mother    Hyperlipidemia Mother    Miscarriages / Stillbirths Mother    Lung cancer Father 47 - 61       smoker   Cancer Father    Early death Father    Breast cancer Paternal Uncle 67 - 60   Breast cancer Cousin 32 - 58     Review of Systems  Constitutional: Negative.   HENT:  Positive for congestion, postnasal drip, rhinorrhea and sinus pressure. Negative for dental problem, drooling, ear discharge, ear pain, facial swelling, hearing loss, mouth sores, nosebleeds, sinus pain, sneezing, sore throat, tinnitus, trouble swallowing and voice change.   Respiratory: Negative.    Cardiovascular: Negative.   Gastrointestinal: Negative.   Musculoskeletal: Negative.   Neurological: Negative.   Psychiatric/Behavioral: Negative.      Per HPI unless specifically indicated above     Objective:    BP 122/83   Pulse 64   Temp 98.5 F (36.9 C) (Oral)   Ht 5' 9 (1.753 m)   Wt 174 lb 3.2 oz (79 kg)   LMP 07/19/2023 (Approximate)   SpO2 97%   BMI 25.72 kg/m   Wt Readings from Last 3 Encounters:  03/21/24 174 lb 3.2 oz (79 kg)   02/17/24 169 lb (76.7 kg)  12/15/23 173 lb (78.5 kg)    Physical Exam Vitals and nursing note reviewed.  Constitutional:      General: She is not in acute distress.    Appearance: Normal appearance. She is not ill-appearing, toxic-appearing or diaphoretic.  HENT:     Head: Normocephalic and atraumatic.     Right Ear: External ear normal.     Left Ear: External ear normal.     Nose: Nose normal.     Mouth/Throat:     Mouth: Mucous membranes are moist.     Pharynx: Oropharynx is clear.  Eyes:     General: No scleral icterus.       Right eye: No discharge.        Left eye: No discharge.     Extraocular Movements: Extraocular movements intact.     Conjunctiva/sclera: Conjunctivae normal.     Pupils: Pupils are equal, round, and reactive to light.  Cardiovascular:     Rate and Rhythm: Normal rate and regular rhythm.     Pulses: Normal pulses.     Heart sounds: Normal heart sounds. No murmur heard.    No friction rub. No gallop.  Pulmonary:     Effort: Pulmonary effort is normal. No respiratory distress.     Breath sounds: Normal breath sounds. No stridor. No wheezing, rhonchi or rales.  Chest:     Chest wall: No tenderness.  Musculoskeletal:        General: Normal range of motion.       Arms:     Cervical back: Normal range of motion and neck supple.  Skin:    General: Skin is warm and dry.     Capillary Refill: Capillary refill takes less than 2 seconds.     Coloration: Skin is not jaundiced or pale.     Findings:  No bruising, erythema, lesion or rash.  Neurological:     General: No focal deficit present.     Mental Status: She is alert and oriented to person, place, and time. Mental status is at baseline.  Psychiatric:        Mood and Affect: Mood normal.        Behavior: Behavior normal.        Thought Content: Thought content normal.        Judgment: Judgment normal.     Results for orders placed or performed during the hospital encounter of 12/20/23   GeneConnect Molecular Screen - Blood ([facility])   Collection Time: 12/20/23  7:49 AM  Result Value Ref Range   Genetic Analysis Overall Interpretation Positive (A)    Genetic Disease Assessed      This is a screening test and does not detect all pathogenic or likely pathogenic variant(s) in the tested genes; diagnostic testing is recommended for individuals with a personal or family history of heart disease or hereditary cancer. Helix Tier One  Population Screen is a screening test that analyzes 11 genes related to hereditary breast and ovarian cancer (HBOC) syndrome, Lynch syndrome, and familial hypercholesterolemia. This test only reports clinically significant pathogenic and likely  pathogenic variants but does not report variants of uncertain significance (VUS). In addition, analysis of the PMS2 gene excludes exons 11-15, which overlap with a known pseudogene (PMS2CL).    Genetic Analysis Report      One Pathogenic variant was detected in the APOB gene. These results indicate a predisposition to, or diagnosis of, familial hypercholesterolemia.The APOB gene is associated with the following condition(s): - familial hypercholesterolemia (FH) (MedGen  UID: [id number])FH is an adult-onset condition characterized by elevated cholesterol in the blood, which may form blockages (plaques) that increase the risks of chest pain (angina), heart attack, and stroke. Affected individuals may have deposits of  cholesterol in the tendons (xanthomas), around the eyelids (xanthelasmas) and colored part of the eyes (irides). Having two pathogenic variants in APOB, one in each copy of the gene (in trans), is associated with a more severe form of FH which presents  at an earlier age.The age of onset, severity, and types of symptoms associated with APOB-related conditions can vary widely, even among affected individuals from the same family.MEDICAL MANAGEMENT recommendations and/or guidelines are available   for  APOB-related conditions: PMID: 68161026, 64533839 Biological family members may be at risk for developing familial hypercholesterolemia.Genetic test results should be interpreted in the context of an individual's personal medical and family history.  Genetic counseling is recommended. Clinical correlation is advised.Additional Considerations - This is a screening test; individuals may still carry pathogenic or likely pathogenic variant(s) in the tested genes that are not detected by this test. - For  individuals at risk for these or other related conditions based on factors including personal or family history, diagnostic testing is recommended. - The Variant Interpretation section below may provide additional details regarding the reported  variant(s). - The absence of pathogenic or likely pathogenic variant(s) in any of the other analyzed genes, while reassuring, does not eliminate the possibility of a hereditary condition; there are other variants and genes associated with  heart disease  and hereditary cancer that are not included in this test.    Genes Tested See Notes    Helix Interpretation      This variant ([id number]) substitutes arginine with glutamine (p.Arg3527Gln) at codon 3527 in the APOB protein. This variant is also known  as Arg3500Gln. It is present in the gnomAD population database (PMID: 67538345) at the highest allele  frequency in the European (non-Finnish) subpopulation (76/128568 alleles, 0.06%). This variant has been observed in numerous individuals with clinical features of familial hypercholesterolemia (PMID: 1745952, 89611520, 81777821, 7436833). In at least one  family, the variant segregated with disease in multiple family members (PMID: [phone]). Functional studies suggest that this variant may affect protein function (PMID: 88884496, 84202141). The most relevant articles have been cited but this list is not  exhaustive. Clinical laboratory interpretations  available in ClinVar are in broad agreement that this variant is pathogenic (ClinVar Variation ID: [id number]). This variant is a well-known, frequently reported Pathogenic variant. In concl usion, this variant  has been classified as Pathogenic.    Disclaimer See Notes    Sequencing Location See Notes    Interpretation Methods and Limitations See Notes       Assessment & Plan:   Problem List Items Addressed This Visit       Other   APOB-related hypercholesterolemia, autosomal dominant - Primary   Has appointment with lipid specialist in about a month. Likely would benefit from repatha. Call with any concerns.       Other Visit Diagnoses       Shoulder mass       Likely ganglion cyst. Will check US . Await results. Treat as needed.   Relevant Orders   US  SOFT TISSUE UPPER EXTREMITY LIMITED LEFT (NON-VASCULAR)     Nasal congestion       Likely allergic. Encouraged flonase. Call with any concerns. Continue to monitor.        Follow up plan: Return January, for physical.

## 2024-04-04 ENCOUNTER — Ambulatory Visit
Admission: RE | Admit: 2024-04-04 | Discharge: 2024-04-04 | Disposition: A | Discharge status: Home / Self Care | Source: Ambulatory Visit | Attending: Family Medicine | Admitting: Family Medicine

## 2024-04-04 DIAGNOSIS — R2230 Localized swelling, mass and lump, unspecified upper limb: Secondary | ICD-10-CM | POA: Insufficient documentation

## 2024-04-08 IMAGING — MG DIGITAL SCREENING BREAST BILAT IMPLANT W/ TOMO W/ CAD
8 of 12 series · 8 of 28 positions shown · non-contrast
Comparison: Previous exam(s).

CLINICAL DATA: Screening.

EXAM:
DIGITAL SCREENING BILATERAL MAMMOGRAM WITH IMPLANTS, CAD AND
TOMOSYNTHESIS
TECHNIQUE: Bilateral screening digital craniocaudal and mediolateral oblique
mammograms were obtained. Bilateral screening digital breast
tomosynthesis was performed. The images were evaluated with
computer-aided detection. Standard and/or implant displaced views
were performed.

[L CC]
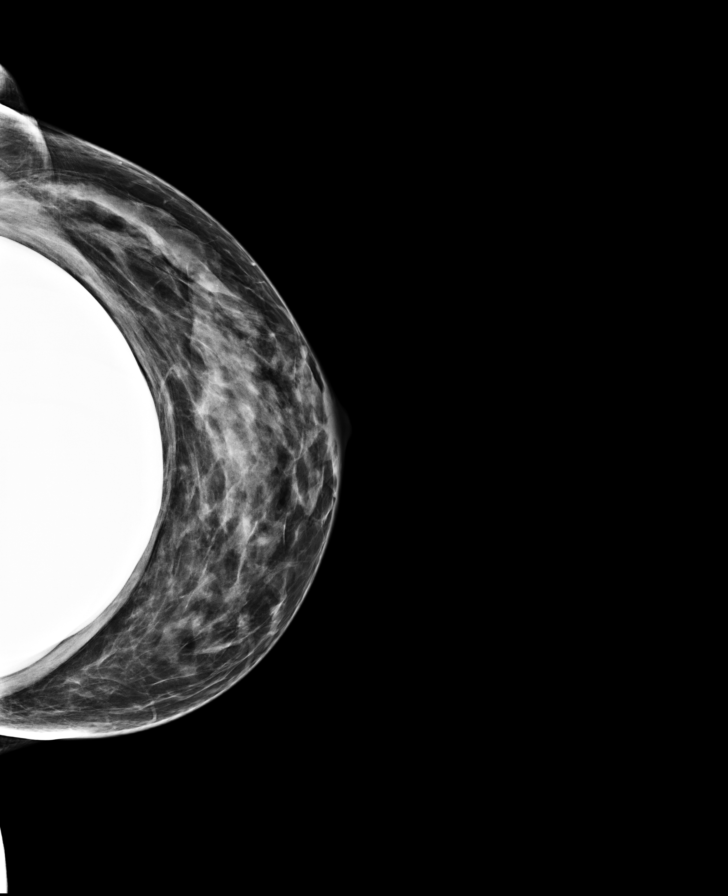

[R MLO]
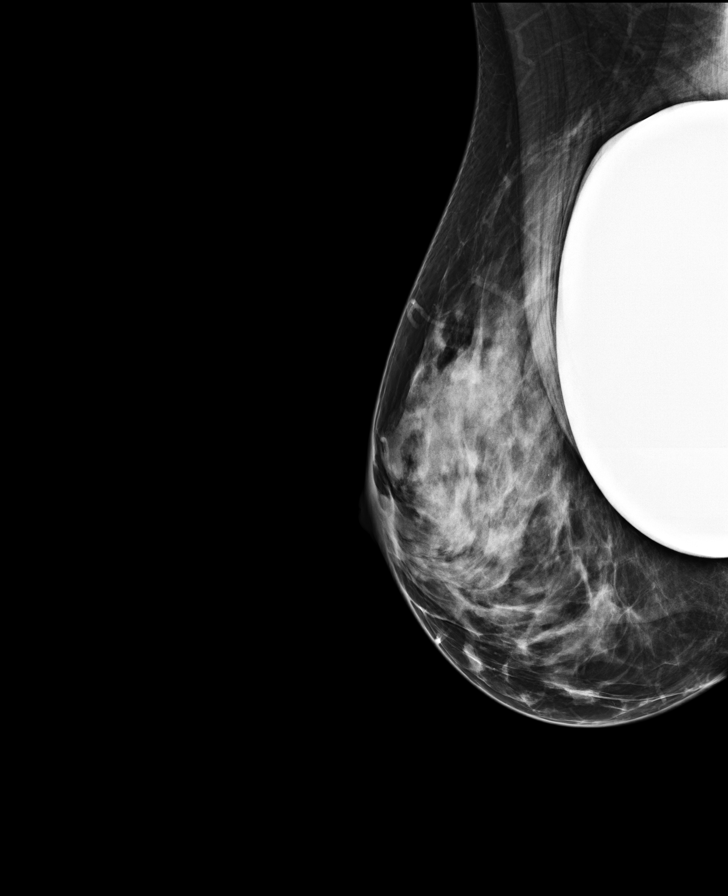

[R CC]
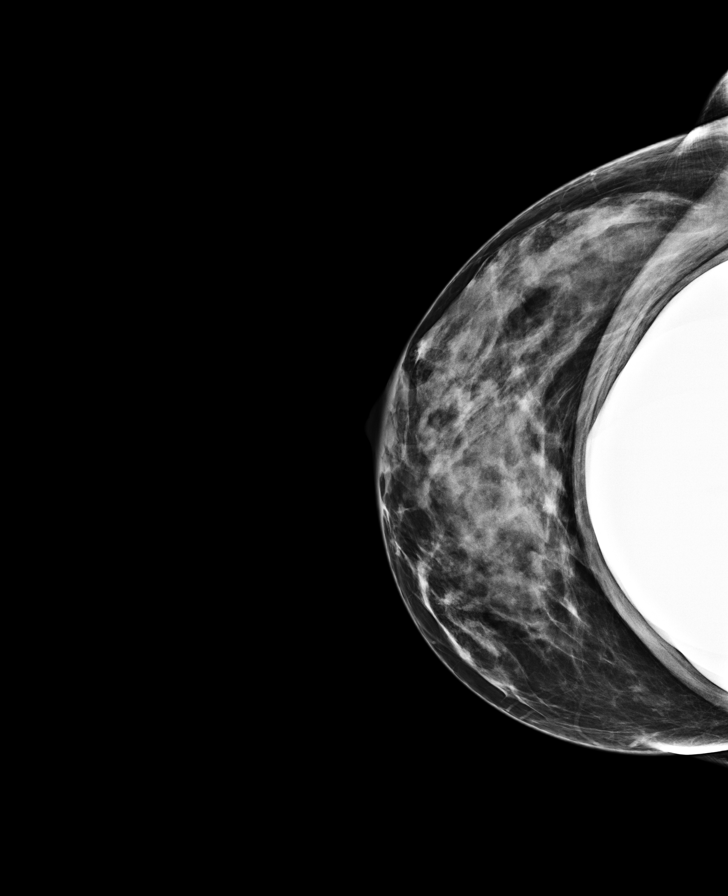

[L MLO]
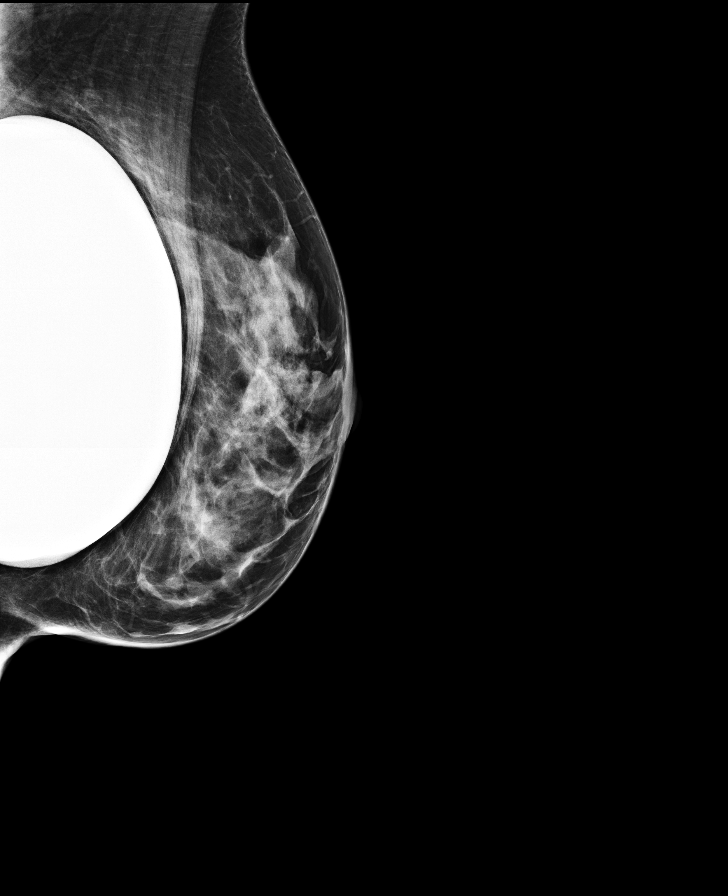

[R MLO synth-2D]
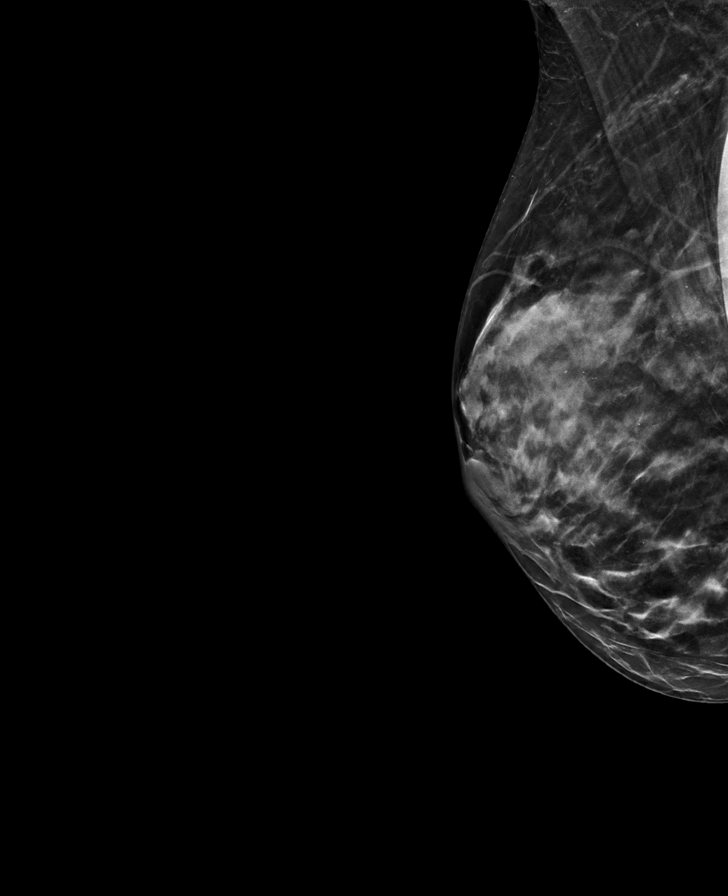

[L CC synth-2D]
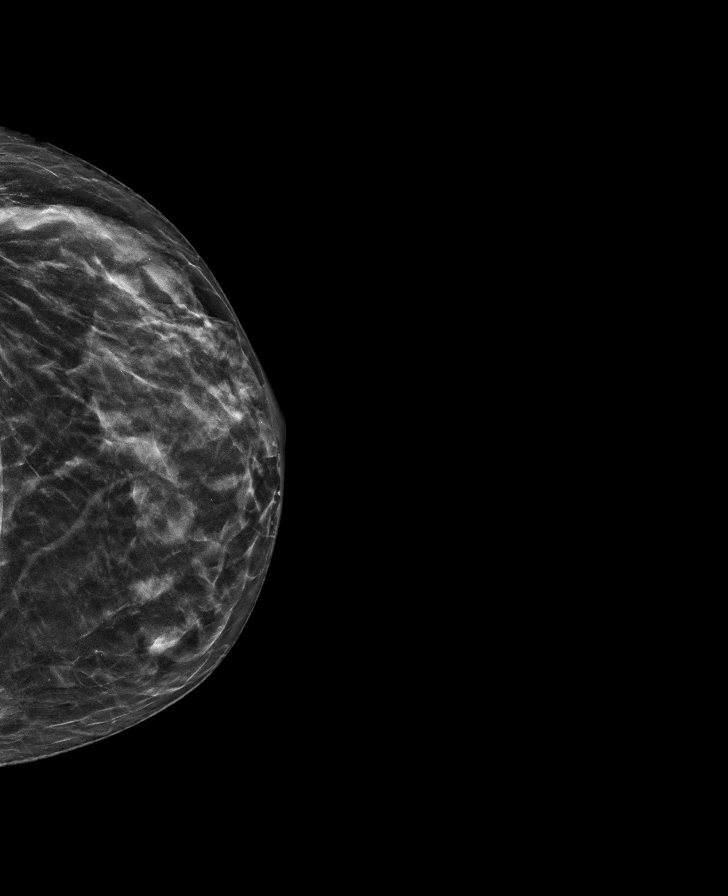

[L MLO synth-2D]
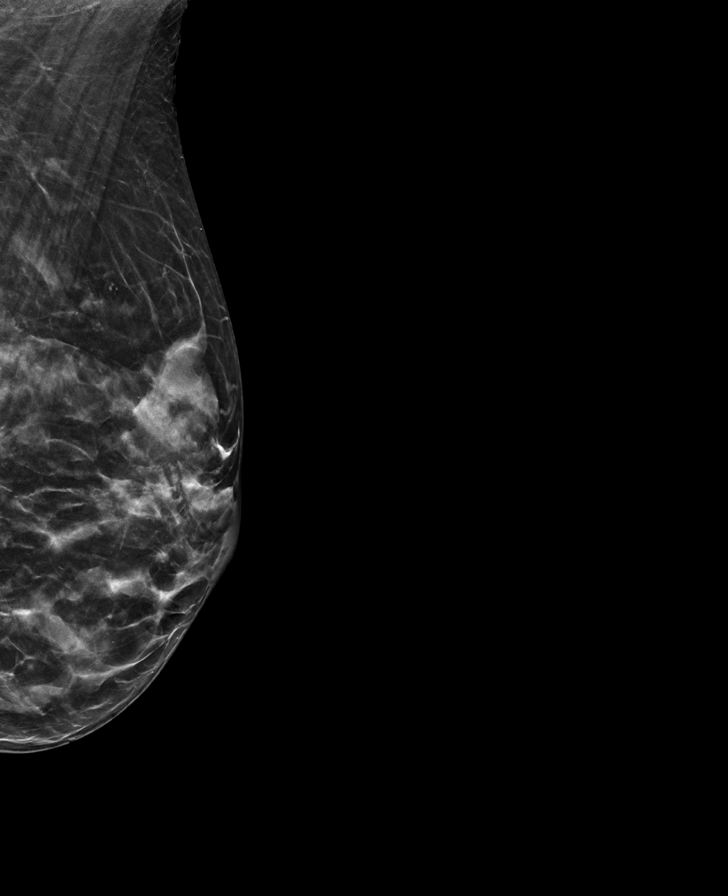

[R CC synth-2D]
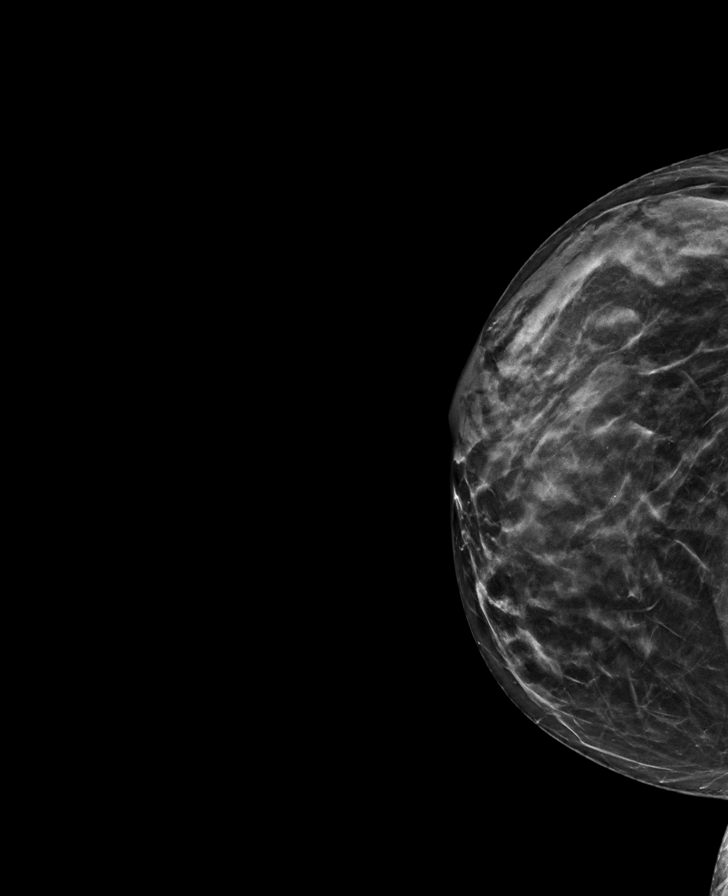

[8 of 28 positions shown; findings below may reference images not displayed]

ACR Breast Density Category c: The breast tissue is heterogeneously
dense, which may obscure small masses.
FINDINGS: The patient has implants. There are no findings suspicious for
malignancy.
IMPRESSION: No mammographic evidence of malignancy. A result letter of this
screening mammogram will be mailed directly to the patient.

RECOMMENDATION:
Screening mammogram in one year. (Code:[id number])

BI-RADS CATEGORY  1:  Negative.

## 2024-04-16 ENCOUNTER — Ambulatory Visit: Payer: Self-pay | Admitting: Family Medicine

## 2024-04-24 ENCOUNTER — Ambulatory Visit: Admitting: Family Medicine

## 2024-04-27 ENCOUNTER — Encounter (HOSPITAL_BASED_OUTPATIENT_CLINIC_OR_DEPARTMENT_OTHER): Payer: Self-pay | Admitting: Internal Medicine

## 2024-04-27 ENCOUNTER — Telehealth (HOSPITAL_BASED_OUTPATIENT_CLINIC_OR_DEPARTMENT_OTHER): Payer: Self-pay | Admitting: *Deleted

## 2024-04-27 ENCOUNTER — Other Ambulatory Visit (HOSPITAL_COMMUNITY): Payer: Self-pay

## 2024-04-27 ENCOUNTER — Telehealth: Payer: Self-pay | Admitting: Pharmacy Technician

## 2024-04-27 ENCOUNTER — Ambulatory Visit (HOSPITAL_BASED_OUTPATIENT_CLINIC_OR_DEPARTMENT_OTHER): Admitting: Internal Medicine

## 2024-04-27 VITALS — BP 115/81 | HR 79 | Resp 17 | Ht 69.0 in | Wt 173.0 lb

## 2024-04-27 DIAGNOSIS — R0602 Shortness of breath: Secondary | ICD-10-CM | POA: Diagnosis not present

## 2024-04-27 DIAGNOSIS — M791 Myalgia, unspecified site: Secondary | ICD-10-CM

## 2024-04-27 DIAGNOSIS — T466X5D Adverse effect of antihyperlipidemic and antiarteriosclerotic drugs, subsequent encounter: Secondary | ICD-10-CM

## 2024-04-27 DIAGNOSIS — E78019 Familial hypercholesterolemia, unspecified: Secondary | ICD-10-CM

## 2024-04-27 DIAGNOSIS — E7801 Familial hypercholesterolemia: Secondary | ICD-10-CM

## 2024-04-27 DIAGNOSIS — T466X5A Adverse effect of antihyperlipidemic and antiarteriosclerotic drugs, initial encounter: Secondary | ICD-10-CM

## 2024-04-27 MED ORDER — METOPROLOL TARTRATE 50 MG PO TABS: 50.0000 mg | ORAL_TABLET | Freq: Once | ORAL | 0 refills | Status: DC

## 2024-04-27 NOTE — Telephone Encounter (Signed)
  She does not have prescription insurance. I called the patient and she asked me to email her the application. Email sent

## 2024-04-27 NOTE — Patient Instructions (Signed)
 Medication Instructions:   Dr. Mona recommends REPATHA (PCSK9). This is an injectable cholesterol medication self-administered once every 14 days. This medication will likely need prior approval with your insurance company, which we will work on. If the medication is not approved initially, we may need to do an appeal with your insurance. If approved, we will provide you with copay and cost information. We'll then send the prescription to your pharmacy. We would have you complete another set of fasting labs between 3-4 months to reassess cholesterol.   REPATHA is self-injected once every 14 days in subcutaneous or fatty tissue - such as belly or side/outer/upper thigh. It is best stored in the refrigerator but is stable at room temp up to 28 days. Please take the pen-injector out of fridge about 30 minutes - 1 hour prior to injection, to allow it to warm closer to room temperature.   This medication is very effective in lowering LDL and can lower LPa, as Dr. Mona mentioned. It is also generally well tolerated -- most common reaction may be cold-like symptoms such as runny nose, scratchy throat, as this is an antibody therapy. It is generally self-limiting and after a few doses, your body should have normalized to the medication.   Here is a demo video: [email or web address]   If you need a co-pay card for Repatha: Lawsponsor.fr If you need a co-pay card for Praluent: [email or web address]  Patient Assistance:    These foundations have funds at various times.   The PAN Foundation: https://www.panfoundation.org/disease-funds/hypercholesterolemia/ -- can sign up for wait list  The [facility] offers assistance to help pay for medication copays.  They will cover copays for all cholesterol lowering meds, including statins, fibrates, omega-3 fish oils like Vascepa, ezetimibe, Repatha, Praluent, Nexletol,  Nexlizet.  The cards are usually good for $2,500 or 12 months, whichever comes first. Our fax # is [phone] (you will need this to apply) Go to healthwellfoundation.org Click on "Apply Now" Answer questions as to whom is applying (patient or representative) Your disease fund will be "hypercholesterolemia - Medicare access" They will ask questions about finances and which medications you are taking for cholesterol When you submit, the approval is usually within minutes.  You will need to print the card information from the site You will need to show this information to your pharmacy, they will bill your Medicare Part D plan first -then bill Health Well --for the copay.   You can also call them at [phone], although the hold times can be quite long.     *If you need a refill on your cardiac medications before your next appointment, please call your pharmacy*   Lab Work:  TODAY ON THE 3RD FLOOR [address]--BMET AND LIPOPROTEIN A  If you have labs (blood work) drawn today and your tests are completely normal, you will receive your results only by: MyChart Message (if you have MyChart) OR A paper copy in the mail If you have any lab test that is abnormal or we need to change your treatment, we will call you to review the results.    Testing/Procedures:    Your cardiac CT will be scheduled at one of the below locations:     [facility] [address]  [place], [place] [address] [phone]    If scheduled at the [facility] V[facility] at [facility] street, please enter the parking lot using the [address] entrance and use the FREE valet service at the patient drop-off area.  Enter the building and check-in with registration on the main floor.    Please follow these instructions carefully (unless otherwise directed):  An IV will be required for this test and Nitroglycerin will be given.  Hold all erectile dysfunction medications at  least 3 days (72 hrs) prior to test. (Ie viagra, cialis, sildenafil, tadalafil, etc)   On the Night Before the Test: Be sure to Drink plenty of water. Do not consume any caffeinated/decaffeinated beverages or chocolate 12 hours prior to your test. Do not take any antihistamines 12 hours prior to your test.    On the Day of the Test: Drink plenty of water until 1 hour prior to the test. Do not eat any food 1 hour prior to test. You may take your regular medications prior to the test.  Take metoprolol  50 MG BY MOUTH (Lopressor ) two hours prior to test.  Patients who wear a continuous glucose monitor MUST remove the device prior to scanning. FEMALES- please wear underwire-free bra if available, avoid dresses & tight clothing       After the Test: Drink plenty of water. After receiving IV contrast, you may experience a mild flushed feeling. This is normal. On occasion, you may experience a mild rash up to 24 hours after the test. This is not dangerous. If this occurs, you can take Benadryl 25 mg, Zyrtec, Claritin, or Allegra  and increase your fluid intake. (Patients taking Tikosyn should avoid Benadryl, and may take Zyrtec, Claritin, or Allegra ) If you experience trouble breathing, this can be serious. If it is severe call 911 IMMEDIATELY. If it is mild, please call our office.  We will call to schedule your test 2-4 weeks out understanding that some insurance companies will need an authorization prior to the service being performed.   For more information and frequently asked questions, please visit our website : [email or web address]  For non-scheduling related questions, please contact the cardiac imaging nurse navigator should you have any questions/concerns: Cardiac Imaging Nurse Navigators Direct Office Dial: [phone]   For scheduling needs, including cancellations and rescheduling, please call [place], [phone].   Follow-Up:  TO BE DETERMINED WITH DR.  HILTY PENDING YOUR RESULTS

## 2024-04-27 NOTE — Telephone Encounter (Signed)
-----   Message from [facility] sent at 04/27/2024 11:15 AM EDT ----- Regarding: RE: REPATHA PER HILTY Hi, She does not have prescription insurance so no prior authorization needed. I called the patient and she asked me to email her the application. Email sent. We will try to get her assistance through [employer]gen ----- Message ----- From: Gladis Porter HERO, LPN Sent: [phone]  10:50 AM EDT To: Rx Prior Auth Team; Rx Med Assistance Team Subject: REPATHA PER HILTY                              Dr. Mona wanting to start on Repatha pending PA  Can you help with this and let us  know the status?   Thanks, [employer]

## 2024-04-28 LAB — BASIC METABOLIC PANEL WITH GFR
BUN/Creatinine Ratio: 29 — ABNORMAL HIGH (ref 9–23)
BUN: 29 mg/dL — ABNORMAL HIGH (ref 6–24)
CO2: 22 mmol/L (ref 20–29)
Calcium: 10.3 mg/dL — ABNORMAL HIGH (ref 8.7–10.2)
Chloride: 100 mmol/L (ref 96–106)
Creatinine, Ser: 1.01 mg/dL — ABNORMAL HIGH (ref 0.57–1.00)
Glucose: 96 mg/dL (ref 70–99)
Potassium: 4.5 mmol/L (ref 3.5–5.2)
Sodium: 137 mmol/L (ref 134–144)
eGFR: 66 mL/min/1.73 (ref >59)

## 2024-04-28 LAB — LIPOPROTEIN A (LPA): Lipoprotein (a): 97.7 nmol/L — ABNORMAL HIGH (ref <75.0)

## 2024-04-29 NOTE — Progress Notes (Signed)
 LIPID CLINIC CONSULT NOTE  Chief Complaint:  Familial hyperlipidemia  Primary Care Physician: Jenna Duwaine SQUIBB, DO  Primary Cardiologist:  None  HPI:  [name] is a 53 y.o. female who is being seen today for the evaluation of medial hyperlipidemia at the request of Jenna Holt,*.  This is a pleasant 53 year old female with a history of high cholesterol that goes back many years.  She was a former Emergency planning/management officer in [place], [place]  and was noted during physical to have high cholesterol.  Recent labs showed total cholesterol 304, triglycerides 68, HDL 73 and LDL 221.  She underwent free helix genetic testing through the health system and was found to have an APO B gene variant, diagnostic of familial hyperlipidemia.  This is an autosomal dominant disorder and she is ready had genetic counseling and understands the implication for children and siblings.  She has tried to manage her cholesterol naturally but is not currently on any lipid-lowering therapies.  There is a strong history of early onset heart disease in her family.  Moreover, recently she has been having some progressive shortness of breath and exercise intolerance, particularly when riding an exercise bicycle.  She describes some chest discomfort but not necessarily pain.  She previously had tried simvastatin  and rosuvastatin  which caused myalgias.  PMHx:  Past Medical History:  Diagnosis Date   Allergy    Anxiety 2007   BRCA negative 12/2022   MyRisk neg; IBIS=11.4%/riskscore=15.1%   Depression 2007   Family history of breast cancer in female    pat uncle; his daughter is BRCA pos; pt is BRCA NEG   Fibroid 02/17/2024   High cholesterol    Thickened endometrium 02/17/2024    Past Surgical History:  Procedure Laterality Date   AUGMENTATION MAMMAPLASTY Bilateral    2013   COLONOSCOPY WITH PROPOFOL  N/A 06/05/2021   Procedure: COLONOSCOPY WITH PROPOFOL ;  Surgeon: Jenna Ole DASEN, MD;   Location: [facility] ENDOSCOPY;  Service: Endoscopy;  Laterality: N/A;   EYE SURGERY  2000   HIP SURGERY Left 06/2019   JOINT REPLACEMENT  2020   SHOULDER ARTHROSCOPY      FAMHx:  Family History  Problem Relation Age of Onset   Heart attack Mother 82 - 28   Early death Mother    Hyperlipidemia Mother    Miscarriages / [place] Mother    Lung cancer Father 15 - 58       smoker   Cancer Father    Early death Father    Breast cancer Paternal Uncle 27 - 71   Breast cancer Cousin 66 - 57    SOCHx:   reports that she has never smoked. She has never used smokeless tobacco. She reports current alcohol use. She reports that she does not use drugs.  ALLERGIES:  Allergies  Allergen Reactions   Latex Dermatitis    If worn   Penicillins Hives   Naproxen  Hives, Itching and Rash    ROS: Pertinent items noted in HPI and remainder of comprehensive ROS otherwise negative.  HOME MEDS: Current Outpatient Medications on File Prior to Visit  Medication Sig Dispense Refill   acyclovir  ointment (ZOVIRAX ) 5 % Apply 1 application. topically every 3 (three) hours. 30 g 2   Ascorbic Acid (VITAMIN C) 1000 MG tablet Take 1,000 mg by mouth daily.     clobetasol  cream (TEMOVATE ) 0.05 % Apply 1 Application topically as directed. Bid to aa hands until smooth, then d/c, may use prn flares, avoid  face, groin, axilla 60 g 1   cyclobenzaprine  (FLEXERIL ) 10 MG tablet Take 1 tablet (10 mg total) by mouth at bedtime as needed for muscle spasms. 30 tablet 3   estradiol  (ESTRACE ) 0.5 MG tablet TAKE 1 TABLET BY MOUTH EVERY DAY 90 tablet 0   MILK THISTLE PO Take 1,300 mg by mouth daily.     NON FORMULARY Take 1 tablet by mouth daily. Beet chews 1000     NON FORMULARY Take 1 tablet by mouth daily. GNC woman multi     NON FORMULARY Take 1 tablet by mouth daily. Probiotic-women 30 billion CFU     NON FORMULARY Take 1 Package by mouth daily. GHOST GREEN (creatine HCL/ L-glutamine) 500mg      NON FORMULARY Take 1 tablet  by mouth daily. Dynamic Mushrooms     NON FORMULARY Take 1 tablet by mouth daily. Btea- Alanine 3400 mg     Omega-3 Fatty Acids (OMEGA-3 FISH OIL PO) Take by mouth.     progesterone  (PROMETRIUM ) 200 MG capsule TAKE 1 CAPS NIGHTLY, 6 NIGHTS ON, 1 NIGHT OFF 90 capsule 0   psyllium (METAMUCIL) 58.6 % packet Take 1 packet by mouth daily.     Vitamin D-Vitamin K (VITAMIN K2-VITAMIN D3 PO) Take 5,000 Int'l Units/day by mouth daily.     zinc gluconate 50 MG tablet Take 50 mg by mouth daily.     Misc Natural Products (YUMVS BEET ROOT-TART CHERRY) 250-0.5 MG CHEW Chew by mouth. (Patient not taking: Reported on 04/27/2024)     Multiple Vitamins-Minerals (WOMENS MULTIVITAMIN PO) Take 1 tablet by mouth daily. (Patient not taking: Reported on 04/27/2024)     No current facility-administered medications on file prior to visit.    LABS/IMAGING: No results found for this or any previous visit (from the past 48 hours). No results found.  LIPID PANEL:    Component Value Date/Time   CHOL 304 (H) 09/01/2023 0818   TRIG 68 09/01/2023 0818   HDL 73 09/01/2023 0818   CHOLHDL 4.2 09/01/2023 0818   LDLCALC 221 (H) 09/01/2023 0818    Lipoprotein (a)  Date/Time Value Ref Range Status  04/27/2024 11:08 AM 97.7 (H) <75.0 nmol/L Final    Comment:    Note:  Values greater than or equal to 75.0 nmol/L may        indicate an independent risk factor for CHD,        but must be evaluated with caution when applied        to non-Caucasian populations due to the        influence of genetic factors on Lp(a) across        ethnicities.      WEIGHTS: Wt Readings from Last 3 Encounters:  04/27/24 173 lb (78.5 kg)  03/21/24 174 lb 3.2 oz (79 kg)  02/17/24 169 lb (76.7 kg)    VITALS: BP 115/81 (BP Location: Left Arm, Patient Position: Sitting, Cuff Size: Normal)   Pulse 79   Resp 17   Ht 5' 9 (1.753 m)   Wt 173 lb (78.5 kg)   LMP 07/19/2023 (Approximate)   SpO2 98%   BMI 25.55 kg/m   EXAM: General  appearance: alert and no distress Lungs: clear to auscultation bilaterally Heart: regular rate and rhythm, S1, S2 normal, no murmur, click, rub or gallop Extremities: extremities normal, atraumatic, no cyanosis or edema Neurologic: Grossly normal  EKG: Deferred  ASSESSMENT: Progressive DOE and exercise intolerance APO B confirmed familial hyperlipidemia, considered pathogenic Family history  of coronary artery disease in first-degree relatives Statin myalgia  PLAN: 1.   Jenna Holt was found to have familial hyperlipidemia with an APO B autosomal dominant variant.  This is considered pathogenic.  There are also other family members with early onset heart disease.  Recently she has been having some progressive dyspnea on exertion and exercise intolerance particularly riding an exercise bicycle.  She has generally maintained good conditioning as a former Emergency planning/management officer.  I am concerned this might represent coronary artery disease.  To that end I have advised coronary CT angiography to evaluate for this.  She is in agreement.  She is also good candidate for PCSK9 inhibitor therapy.  Will reach out for prior authorization for Repatha.  Plan repeat lipids including NMR and LP(a) in 3 to 4 months.  Thanks again for the kind referral.  Vinie KYM Maxcy, MD, [facility], FNLA, FACP  [place]  [facility]  Medical Director of the Advanced Lipid Disorders &  Cardiovascular Risk Reduction Clinic Diplomate of the American Board of Clinical Lipidology Attending Cardiologist  Direct Dial: [phone]  Fax: [phone]  Website:  [email or web address] Vinie BROCKS Othello Dickenson 04/29/2024, 4:02 PM

## 2024-04-30 ENCOUNTER — Ambulatory Visit: Payer: Self-pay | Admitting: Internal Medicine

## 2024-04-30 ENCOUNTER — Encounter (HOSPITAL_COMMUNITY): Payer: Self-pay

## 2024-04-30 NOTE — Telephone Encounter (Signed)
 Routed to Dr. Mona primary nurse to have signed when he is in clinic again next Monday.

## 2024-04-30 NOTE — Telephone Encounter (Signed)
 Hi, we received the patients application for repatha back and this is scanned in media under REPATHA PATIENT ASSISTANCE FORM . Can someone please get the provider to sign and fax back to [phone]? Thank you

## 2024-05-02 ENCOUNTER — Other Ambulatory Visit: Payer: Self-pay

## 2024-05-02 ENCOUNTER — Ambulatory Visit (HOSPITAL_COMMUNITY)
Admission: RE | Admit: 2024-05-02 | Discharge: 2024-05-02 | Disposition: A | Discharge status: Home / Self Care | Source: Ambulatory Visit | Attending: Internal Medicine | Admitting: Internal Medicine

## 2024-05-02 DIAGNOSIS — E7801 Familial hypercholesterolemia: Secondary | ICD-10-CM | POA: Insufficient documentation

## 2024-05-02 DIAGNOSIS — Z3009 Encounter for other general counseling and advice on contraception: Secondary | ICD-10-CM

## 2024-05-02 DIAGNOSIS — N939 Abnormal uterine and vaginal bleeding, unspecified: Secondary | ICD-10-CM

## 2024-05-02 DIAGNOSIS — R0602 Shortness of breath: Secondary | ICD-10-CM | POA: Diagnosis not present

## 2024-05-02 MED ORDER — IOHEXOL 350 MG/ML SOLN
100.0000 mL | Freq: Once | INTRAVENOUS | Status: AC | PRN
Start: 2024-05-02 — End: 2024-05-02
  Administered 2024-05-02: 100 mL via INTRAVENOUS

## 2024-05-02 MED ORDER — NITROGLYCERIN 0.4 MG SL SUBL
0.8000 mg | SUBLINGUAL_TABLET | Freq: Once | SUBLINGUAL | Status: AC
  Administered 2024-05-02: 0.8 mg via SUBLINGUAL

## 2024-05-07 NOTE — Telephone Encounter (Signed)
 MD signed repatha PAP Faxed to [facility] with prior auth/patient assistance team

## 2024-05-07 NOTE — Telephone Encounter (Signed)
 Received provider portion. Still need patient portion

## 2024-05-07 NOTE — Telephone Encounter (Signed)
 Faxed application to [employer]

## 2024-05-11 ENCOUNTER — Encounter (HOSPITAL_BASED_OUTPATIENT_CLINIC_OR_DEPARTMENT_OTHER): Payer: Self-pay | Admitting: Internal Medicine

## 2024-05-11 NOTE — Telephone Encounter (Signed)
 PAP: Patient assistance application for repatha has been approved by PAP Companies: [employer] from 05/11/24 to 05/10/25. Medication should be delivered to PAP Delivery: Home. For further shipping updates, please contact [phone]. Patient ID is: [id number]

## 2024-05-11 NOTE — Telephone Encounter (Signed)
 I called [employer] to follow up on application and they said they have everything and it is still in process and they will let us  know

## 2024-05-17 ENCOUNTER — Encounter: Payer: Self-pay | Admitting: Family Medicine

## 2024-05-18 ENCOUNTER — Encounter: Payer: Self-pay | Admitting: Obstetrics

## 2024-05-18 ENCOUNTER — Other Ambulatory Visit (HOSPITAL_COMMUNITY)
Admission: RE | Admit: 2024-05-18 | Discharge: 2024-05-18 | Disposition: A | Discharge status: Home / Self Care | Source: Ambulatory Visit | Attending: Obstetrics | Admitting: Obstetrics

## 2024-05-18 ENCOUNTER — Ambulatory Visit: Admitting: Obstetrics

## 2024-05-18 VITALS — BP 118/84 | HR 74 | Ht 69.0 in | Wt 174.7 lb

## 2024-05-18 DIAGNOSIS — N951 Menopausal and female climacteric states: Secondary | ICD-10-CM

## 2024-05-18 DIAGNOSIS — Z9289 Personal history of other medical treatment: Secondary | ICD-10-CM

## 2024-05-18 DIAGNOSIS — Z01419 Encounter for gynecological examination (general) (routine) without abnormal findings: Secondary | ICD-10-CM | POA: Diagnosis not present

## 2024-05-18 DIAGNOSIS — N950 Postmenopausal bleeding: Secondary | ICD-10-CM | POA: Diagnosis not present

## 2024-05-18 DIAGNOSIS — R9389 Abnormal findings on diagnostic imaging of other specified body structures: Secondary | ICD-10-CM

## 2024-05-18 DIAGNOSIS — Z9229 Personal history of other drug therapy: Secondary | ICD-10-CM

## 2024-05-18 NOTE — Telephone Encounter (Signed)
 Scheduled

## 2024-05-18 NOTE — Telephone Encounter (Signed)
 Needs appt if wants to discuss GLP-1

## 2024-05-18 NOTE — Progress Notes (Signed)
 AEX. Needing to establish care so that she can have hysterectomy. Would like to discuss management of menopause. Declines STI testing.

## 2024-05-18 NOTE — Progress Notes (Signed)
 Subjective:        Jenna Holt is a 53 y.o. female here for a routine exam.  Current complaints: Postmenopausal bleeding, and severe hot flashes.    Personal health questionnaire:  Is patient Ashkenazi Jewish, have a family history of breast and/or ovarian cancer: yes Is there a family history of uterine cancer diagnosed at age < 59, gastrointestinal cancer, urinary tract cancer, family member who is a Personnel officer syndrome-associated carrier: no Is the patient overweight and hypertensive, family history of diabetes, personal history of gestational diabetes, preeclampsia or PCOS: no Is patient over 77, have PCOS,  family history of premature CHD under age 56, diabetes, smoke, have hypertension or peripheral artery disease:  no At any time, has a partner hit, kicked or otherwise hurt or frightened you?: no Over the past 2 weeks, have you felt down, depressed or hopeless?: no Over the past 2 weeks, have you felt little interest or pleasure in doing things?:no   Gynecologic History Patient's last menstrual period was 07/19/2023 (approximate). Contraception: post menopausal status Last Pap: 12-27-2022. Results were: normal Last mammogram: 2025. Results were: normal  Obstetric History OB History  Gravida Para Term Preterm AB Living  1 1 1   1   SAB IAB Ectopic Multiple Live Births      1    # Outcome Date GA Lbr Len/2nd Weight Sex Type Anes PTL Lv  1 Term             Past Medical History:  Diagnosis Date   Allergy    Anxiety 2007   BRCA negative 12/2022   MyRisk neg; IBIS=11.4%/riskscore=15.1%   Depression 2007   Family history of breast cancer in female    pat uncle; his daughter is BRCA pos; pt is BRCA NEG   Fibroid 02/17/2024   High cholesterol    Thickened endometrium 02/17/2024    Past Surgical History:  Procedure Laterality Date   AUGMENTATION MAMMAPLASTY Bilateral    2013   COLONOSCOPY WITH PROPOFOL  N/A 06/05/2021   Procedure: COLONOSCOPY WITH PROPOFOL ;   Surgeon: Maryruth Ole DASEN, MD;  Location: [facility] ENDOSCOPY;  Service: Endoscopy;  Laterality: N/A;   EYE SURGERY  2000   HIP SURGERY Left 06/2019   JOINT REPLACEMENT  2020   SHOULDER ARTHROSCOPY       Current Outpatient Medications:    acyclovir  ointment (ZOVIRAX ) 5 %, Apply 1 application. topically every 3 (three) hours., Disp: 30 g, Rfl: 2   Ascorbic Acid (VITAMIN C) 1000 MG tablet, Take 1,000 mg by mouth daily., Disp: , Rfl:    clobetasol  cream (TEMOVATE ) 0.05 %, Apply 1 Application topically as directed. Bid to aa hands until smooth, then d/c, may use prn flares, avoid face, groin, axilla, Disp: 60 g, Rfl: 1   cyclobenzaprine  (FLEXERIL ) 10 MG tablet, Take 1 tablet (10 mg total) by mouth at bedtime as needed for muscle spasms., Disp: 30 tablet, Rfl: 3   estradiol  (ESTRACE ) 0.5 MG tablet, TAKE 1 TABLET BY MOUTH EVERY DAY, Disp: 90 tablet, Rfl: 0   MILK THISTLE PO, Take 1,300 mg by mouth daily., Disp: , Rfl:    NON FORMULARY, Take 1 tablet by mouth daily. Beet chews 1000, Disp: , Rfl:    NON FORMULARY, Take 1 tablet by mouth daily. GNC woman multi, Disp: , Rfl:    NON FORMULARY, Take 1 tablet by mouth daily. Probiotic-women 30 billion CFU, Disp: , Rfl:    NON FORMULARY, Take 1 Package by mouth daily. GHOST GREEN (  creatine HCL/ L-glutamine) 500mg , Disp: , Rfl:    NON FORMULARY, Take 1 tablet by mouth daily. Dynamic Mushrooms, Disp: , Rfl:    NON FORMULARY, Take 1 tablet by mouth daily. Btea- Alanine 3400 mg, Disp: , Rfl:    Omega-3 Fatty Acids (OMEGA-3 FISH OIL PO), Take by mouth., Disp: , Rfl:    progesterone  (PROMETRIUM ) 200 MG capsule, TAKE 1 CAPS NIGHTLY, 6 NIGHTS ON, 1 NIGHT OFF, Disp: 90 capsule, Rfl: 0   psyllium (METAMUCIL) 58.6 % packet, Take 1 packet by mouth daily., Disp: , Rfl:    Vitamin D-Vitamin K (VITAMIN K2-VITAMIN D3 PO), Take 5,000 Int'l Units/day by mouth daily., Disp: , Rfl:    zinc gluconate 50 MG tablet, Take 50 mg by mouth daily., Disp: , Rfl:    metoprolol  tartrate  (LOPRESSOR ) 50 MG tablet, Take 1 tablet (50 mg total) by mouth once for 1 dose. Take 90-120 minutes prior to scan. Hold for SBP less than 110. (Patient not taking: Reported on 05/18/2024), Disp: 1 tablet, Rfl: 0   Misc Natural Products (YUMVS BEET ROOT-TART CHERRY) 250-0.5 MG CHEW, Chew by mouth. (Patient not taking: Reported on 05/18/2024), Disp: , Rfl:    Multiple Vitamins-Minerals (WOMENS MULTIVITAMIN PO), Take 1 tablet by mouth daily. (Patient not taking: Reported on 05/18/2024), Disp: , Rfl:  Allergies  Allergen Reactions   Latex Dermatitis    If worn   Penicillins Hives   Naproxen  Hives, Itching and Rash    Social History   Tobacco Use   Smoking status: Never   Smokeless tobacco: Never  Substance Use Topics   Alcohol use: Yes    Comment: occ    Family History  Problem Relation Age of Onset   Heart attack Mother 53 - 34   Early death Mother    Hyperlipidemia Mother    Miscarriages / Stillbirths Mother    Lung cancer Father 35 - 78       smoker   Cancer Father    Early death Father    Breast cancer Paternal Uncle 48 - 45   Breast cancer Cousin 78 - 78      Review of Systems  Constitutional: negative for fatigue and weight loss Respiratory: negative for cough and wheezing Cardiovascular: negative for chest pain, fatigue and palpitations Gastrointestinal: negative for abdominal pain and change in bowel habits Musculoskeletal:negative for myalgias Neurological: negative for gait problems and tremors Behavioral/Psych: negative for abusive relationship, depression Endocrine: negative for temperature intolerance    Genitourinary: positive for PMB and hot flashes.  negative for genital lesions,  sexual problems and vaginal discharge Integument/breast: negative for breast lump, breast tenderness, nipple discharge and skin lesion(s)    Objective:       BP 118/84   Pulse 74   Ht 5' 9 (1.753 m)   Wt 174 lb 11.2 oz (79.2 kg)   LMP 07/19/2023 (Approximate)   BMI 25.80  kg/m  General:   Alert and no distress  Skin:   no rash or abnormalities  Lungs:   clear to auscultation bilaterally  Heart:   regular rate and rhythm, S1, S2 normal, no murmur, click, rub or gallop  Breasts:   normal without suspicious masses, skin or nipple changes or axillary nodes  Abdomen:  normal findings: no organomegaly, soft, non-tender and no hernia  Pelvis:  External genitalia: normal general appearance Urinary system: urethral meatus normal and bladder without fullness, nontender Vaginal: normal without tenderness, induration or masses Cervix: normal appearance Adnexa: normal bimanual exam  Uterus: anteverted and non-tender, normal size   Lab Review Urine pregnancy test Labs reviewed yes Radiologic studies reviewed yes  I have spent a total of 20 minutes of face-to-face time, excluding clinical staff time, reviewing notes and preparing to see patient, ordering tests and/or medications, and counseling the patient.   Assessment:    1. Encounter for routine gynecological examination with Papanicolaou smear of cervix (Primary) Rx: - Cytology - PAP( [place])  2. Menopausal and female climacteric states - severe hot flashes  3. History of postmenopausal HRT - taking E-P pills.  [place]  Patch recommended but she does not have insurance and cannot afford the patch.at this time  4. PMB (postmenopausal bleeding)  5. Endometrial thickening on ultrasound  6. History of endometrial biopsy, WNL's - consider Hysteroscopy / D&C.  Option discussed.     Plan:    Education reviewed: calcium  supplements, depression evaluation, low fat, low cholesterol diet, safe sex/STD prevention, self breast exams, skin cancer screening, and weight bearing exercise. Follow up in: 4 weeks.   Dr. Jeralyn follow up   CARLIN RONAL CENTERS, MD, FACOG Attending Obstetrician & Gynecologist, [facility] for [facility], [facility], [place] 05/18/2024

## 2024-05-22 LAB — CYTOLOGY - PAP
Comment: NEGATIVE
Diagnosis: NEGATIVE
Diagnosis: REACTIVE
High risk HPV: NEGATIVE

## 2024-05-29 ENCOUNTER — Encounter: Admitting: Obstetrics and Gynecology

## 2024-05-31 ENCOUNTER — Encounter: Payer: Self-pay | Admitting: Obstetrics

## 2024-06-10 ENCOUNTER — Other Ambulatory Visit: Payer: Self-pay | Admitting: Obstetrics

## 2024-06-10 DIAGNOSIS — Z79890 Hormone replacement therapy (postmenopausal): Secondary | ICD-10-CM

## 2024-06-10 DIAGNOSIS — N951 Menopausal and female climacteric states: Secondary | ICD-10-CM

## 2024-06-18 ENCOUNTER — Encounter: Payer: Self-pay | Admitting: Family Medicine

## 2024-06-18 ENCOUNTER — Ambulatory Visit: Admitting: Family Medicine

## 2024-06-18 VITALS — BP 123/87 | HR 84 | Temp 98.6°F | Ht 69.0 in | Wt 179.1 lb

## 2024-06-18 DIAGNOSIS — R5383 Other fatigue: Secondary | ICD-10-CM

## 2024-06-18 DIAGNOSIS — Z0000 Encounter for general adult medical examination without abnormal findings: Secondary | ICD-10-CM | POA: Diagnosis not present

## 2024-06-18 DIAGNOSIS — E663 Overweight: Secondary | ICD-10-CM | POA: Insufficient documentation

## 2024-06-18 NOTE — Progress Notes (Signed)
 BP 123/87   Pulse 84   Temp 98.6 F (37 C) (Oral)   Ht 5' 9 (1.753 m)   Wt 179 lb 2 oz (81.3 kg)   LMP 07/19/2023 (Approximate)   SpO2 98%   BMI 26.45 kg/m    Subjective:    Patient ID: Jenna Holt, female    DOB: 27-Sep-1970, 53 y.o.   MRN: [id number]  HPI: Jenna Holt is a 53 y.o. female presenting on 06/18/2024 for comprehensive medical examination. Current medical complaints include:  WEIGHT GAIN Duration: past several months Previous attempts at weight loss: weight loss app, working out several times a week, portion control Complications of obesity: HLD, low back pain Peak weight: current (179) Weight loss goal: 155 Weight loss to date: none Requesting obesity pharmacotherapy: yes Current weight loss supplements/medications: no Previous weight loss supplements/meds: no  Menopausal Symptoms: yes  Depression Screen done today and results listed below:     06/18/2024    8:48 AM 05/18/2024    9:56 AM 03/21/2024   10:11 AM  Depression screen PHQ 2/9  Decreased Interest 0 0 0  Down, Depressed, Hopeless 0 0 0  PHQ - 2 Score 0 0 0  Altered sleeping 1 2 1   Tired, decreased energy 1 2 1   Change in appetite 0 0 0  Feeling bad or failure about yourself  0 0 0  Trouble concentrating 0 0 0  Moving slowly or fidgety/restless 0 0 0  Suicidal thoughts 0 0 0  PHQ-9 Score 2 4  2    Difficult doing work/chores Not difficult at all  Not difficult at all     Data saved with a previous flowsheet row definition     Past Medical History:  Past Medical History:  Diagnosis Date   Allergy    Anxiety 2007   BRCA negative 12/2022   MyRisk neg; IBIS=11.4%/riskscore=15.1%   Depression 2007   Family history of breast cancer in female    pat uncle; his daughter is BRCA pos; pt is BRCA NEG   Fibroid 02/17/2024   High cholesterol    Thickened endometrium 02/17/2024    Surgical History:  Past Surgical History:  Procedure Laterality Date    AUGMENTATION MAMMAPLASTY Bilateral    2013   COLONOSCOPY WITH PROPOFOL  N/A 06/05/2021   Procedure: COLONOSCOPY WITH PROPOFOL ;  Surgeon: Maryruth Ole DASEN, MD;  Location: [facility] ENDOSCOPY;  Service: Endoscopy;  Laterality: N/A;   EYE SURGERY  2000   HIP SURGERY Left 06/2019   JOINT REPLACEMENT  2020   SHOULDER ARTHROSCOPY      Medications:  Current Outpatient Medications on File Prior to Visit  Medication Sig   acyclovir  ointment (ZOVIRAX ) 5 % Apply 1 application. topically every 3 (three) hours.   Ascorbic Acid (VITAMIN C) 1000 MG tablet Take 1,000 mg by mouth daily.   clobetasol  cream (TEMOVATE ) 0.05 % Apply 1 Application topically as directed. Bid to aa hands until smooth, then d/c, may use prn flares, avoid face, groin, axilla   cyclobenzaprine  (FLEXERIL ) 10 MG tablet Take 1 tablet (10 mg total) by mouth at bedtime as needed for muscle spasms.   estradiol  (ESTRACE ) 0.5 MG tablet TAKE 1 TABLET BY MOUTH EVERY DAY   Evolocumab (REPATHA SURECLICK) 140 MG/ML SOAJ Inject 140 mg into the skin every 14 (fourteen) days.   MILK THISTLE PO Take 1,300 mg by mouth daily.   NON FORMULARY Take 1 tablet by mouth daily. Beet chews 1000   NON FORMULARY Take  1 tablet by mouth daily. GNC woman multi   NON FORMULARY Take 1 tablet by mouth daily. Probiotic-women 30 billion CFU   NON FORMULARY Take 1 Package by mouth daily. GHOST GREEN (creatine HCL/ L-glutamine) 500mg    NON FORMULARY Take 1 tablet by mouth daily. Dynamic Mushrooms   NON FORMULARY Take 1 tablet by mouth daily. Btea- Alanine 3400 mg   Omega-3 Fatty Acids (OMEGA-3 FISH OIL PO) Take by mouth.   progesterone  (PROMETRIUM ) 200 MG capsule TAKE 1 CAPS NIGHTLY, 6 NIGHTS ON, 1 NIGHT OFF   psyllium (METAMUCIL) 58.6 % packet Take 1 packet by mouth daily.   Vitamin D-Vitamin K (VITAMIN K2-VITAMIN D3 PO) Take 5,000 Int'l Units/day by mouth daily.   zinc gluconate 50 MG tablet Take 50 mg by mouth daily.   No current facility-administered medications on  file prior to visit.    Allergies:  Allergies  Allergen Reactions   Latex Dermatitis    If worn   Penicillins Hives   Naproxen  Hives, Itching and Rash    Social History:  Social History   Socioeconomic History   Marital status: Married    Spouse name: Not on file   Number of children: 1   Years of education: Not on file   Highest education level: Associate degree: academic program  Occupational History   Not on file  Tobacco Use   Smoking status: Never   Smokeless tobacco: Never  Vaping Use   Vaping status: Never Used  Substance and Sexual Activity   Alcohol use: Yes    Comment: occ   Drug use: No   Sexual activity: Yes    Birth control/protection: Post-menopausal  Other Topics Concern   Not on file  Social History Narrative   Not on file   Social Drivers of Health   Financial Resource Strain: Low Risk  (06/14/2024)   Overall Financial Resource Strain (CARDIA)    Difficulty of Paying Living Expenses: Not hard at all  Food Insecurity: No Food Insecurity (06/14/2024)   Hunger Vital Sign    Worried About Running Out of Food in the Last Year: Never true    Ran Out of Food in the Last Year: Never true  Transportation Needs: No Transportation Needs (06/14/2024)   PRAPARE - Administrator, Civil Service (Medical): No    Lack of Transportation (Non-Medical): No  Physical Activity: Insufficiently Active (06/14/2024)   Exercise Vital Sign    Days of Exercise per Week: 3 days    Minutes of Exercise per Session: 40 min  Stress: Stress Concern Present (06/14/2024)   [employer] of Occupational Health - Occupational Stress Questionnaire    Feeling of Stress: To some extent  Social Connections: Socially Isolated (06/14/2024)   Social Connection and Isolation Panel    Frequency of Communication with Friends and Family: Never    Frequency of Social Gatherings with Friends and Family: Once a week    Attends Religious Services: Never    Database Administrator  or Organizations: No    Attends Engineer, Structural: Not on file    Marital Status: Married  Catering Manager Violence: Not At Risk (03/21/2024)   Humiliation, Afraid, Rape, and Kick questionnaire    Fear of Current or Ex-Partner: No    Emotionally Abused: No    Physically Abused: No    Sexually Abused: No   Social History   Tobacco Use  Smoking Status Never  Smokeless Tobacco Never   Social History  Substance and Sexual Activity  Alcohol Use Yes   Comment: occ    Family History:  Family History  Problem Relation Age of Onset   Heart attack Mother 31 - 61   Early death Mother    Hyperlipidemia Mother    Miscarriages / Stillbirths Mother    Lung cancer Father 76 - 26       smoker   Cancer Father    Early death Father    Breast cancer Paternal Uncle 53 - 6   Breast cancer Cousin 75 - 40    Past medical history, surgical history, medications, allergies, family history and social history reviewed with patient today and changes made to appropriate areas of the chart.   Review of Systems  Constitutional:  Positive for diaphoresis and malaise/fatigue. Negative for chills, fever and weight loss.  HENT: Negative.    Eyes: Negative.   Respiratory: Negative.    Cardiovascular: Negative.   Gastrointestinal:  Positive for constipation and nausea (yesterday AM). Negative for abdominal pain, blood in stool, diarrhea, heartburn, melena and vomiting.  Genitourinary: Negative.   Musculoskeletal: Negative.   Skin:  Positive for itching. Negative for rash.  Neurological: Negative.   Endo/Heme/Allergies:  Positive for environmental allergies. Negative for polydipsia. Does not bruise/bleed easily.  Psychiatric/Behavioral:  Negative for depression, hallucinations, memory loss, substance abuse and suicidal ideas. The patient has insomnia. The patient is not nervous/anxious.    All other ROS negative except what is listed above and in the HPI.      Objective:    BP 123/87    Pulse 84   Temp 98.6 F (37 C) (Oral)   Ht 5' 9 (1.753 m)   Wt 179 lb 2 oz (81.3 kg)   LMP 07/19/2023 (Approximate)   SpO2 98%   BMI 26.45 kg/m   Wt Readings from Last 3 Encounters:  06/18/24 179 lb 2 oz (81.3 kg)  05/18/24 174 lb 11.2 oz (79.2 kg)  04/27/24 173 lb (78.5 kg)    Physical Exam Vitals and nursing note reviewed.  Constitutional:      General: She is not in acute distress.    Appearance: Normal appearance. She is not ill-appearing, toxic-appearing or diaphoretic.  HENT:     Head: Normocephalic and atraumatic.     Right Ear: Tympanic membrane, ear canal and external ear normal. There is no impacted cerumen.     Left Ear: Tympanic membrane, ear canal and external ear normal. There is no impacted cerumen.     Nose: Nose normal. No congestion or rhinorrhea.     Mouth/Throat:     Mouth: Mucous membranes are moist.     Pharynx: Oropharynx is clear. No oropharyngeal exudate or posterior oropharyngeal erythema.  Eyes:     General: No scleral icterus.       Right eye: No discharge.        Left eye: No discharge.     Extraocular Movements: Extraocular movements intact.     Conjunctiva/sclera: Conjunctivae normal.     Pupils: Pupils are equal, round, and reactive to light.  Neck:     Vascular: No carotid bruit.  Cardiovascular:     Rate and Rhythm: Normal rate and regular rhythm.     Pulses: Normal pulses.     Heart sounds: No murmur heard.    No friction rub. No gallop.  Pulmonary:     Effort: Pulmonary effort is normal. No respiratory distress.     Breath sounds: Normal breath sounds. No stridor. No  wheezing, rhonchi or rales.  Chest:     Chest wall: No tenderness.  Abdominal:     General: Abdomen is flat. Bowel sounds are normal. There is no distension.     Palpations: Abdomen is soft. There is no mass.     Tenderness: There is no abdominal tenderness. There is no right CVA tenderness, left CVA tenderness, guarding or rebound.     Hernia: No hernia is  present.  Genitourinary:    Comments: Breast and pelvic exams deferred with shared decision making Musculoskeletal:        General: No swelling, tenderness, deformity or signs of injury.     Cervical back: Normal range of motion and neck supple. No rigidity. No muscular tenderness.     Right lower leg: No edema.     Left lower leg: No edema.  Lymphadenopathy:     Cervical: No cervical adenopathy.  Skin:    General: Skin is warm and dry.     Capillary Refill: Capillary refill takes less than 2 seconds.     Coloration: Skin is not jaundiced or pale.     Findings: No bruising, erythema, lesion or rash.  Neurological:     General: No focal deficit present.     Mental Status: She is alert and oriented to person, place, and time. Mental status is at baseline.     Cranial Nerves: No cranial nerve deficit.     Sensory: No sensory deficit.     Motor: No weakness.     Coordination: Coordination normal.     Gait: Gait normal.     Deep Tendon Reflexes: Reflexes normal.  Psychiatric:        Mood and Affect: Mood normal.        Behavior: Behavior normal.        Thought Content: Thought content normal.        Judgment: Judgment normal.     Results for orders placed or performed in visit on 05/18/24  Cytology - PAP( [facility])   Collection Time: 05/18/24 10:34 AM  Result Value Ref Range   High risk HPV Negative    Adequacy      Satisfactory for evaluation; transformation zone component PRESENT.   Diagnosis      - Negative for Intraepithelial Lesions or Malignancy (NILM)   Diagnosis - Benign reactive/reparative changes    Comment Normal Reference Range HPV - Negative       Assessment & Plan:   Problem List Items Addressed This Visit       Other   Overweight   Discussed that she does not qualify for any weight loss medications based on her BMI and health issues. Will check labs to look for other issues. Continue diet and exercise.       Other Visit Diagnoses       Routine  general medical examination at a health care facility    -  Primary   Vaccines up to date. Screening labs checked today. Pap, mammo and colonoscopy up to date. Continue diet and exercise. Call with any concerns.   Relevant Orders   CBC with Differential/Platelet   Comprehensive metabolic panel with GFR   Lipid Panel w/o Chol/HDL Ratio   TSH   HIV Antibody (routine testing w rflx)   Hepatitis B surface antibody,quantitative   Hepatitis C Antibody     Fatigue, unspecified type       Labs drawn today. Await results.   Relevant Orders   VITAMIN D 25  Hydroxy (Vit-D Deficiency, Fractures)   B12        Follow up plan: Return as able for shave biopsy.   LABORATORY TESTING:  - Pap smear: up to date  IMMUNIZATIONS:   - Tdap: Tetanus vaccination status reviewed: last tetanus booster within 10 years. - Influenza: Refused - Prevnar: Refused - COVID: Refused - HPV: Refused - Shingrix vaccine: Up to date  SCREENING: -Mammogram: Up to date  - Colonoscopy: Up to date   PATIENT COUNSELING:   Advised to take 1 mg of folate supplement per day if capable of pregnancy.   Sexuality: Discussed sexually transmitted diseases, partner selection, use of condoms, avoidance of unintended pregnancy  and contraceptive alternatives.   Advised to avoid cigarette smoking.  I discussed with the patient that most people either abstain from alcohol or drink within safe limits (<=14/week and <=4 drinks/occasion for males, <=7/weeks and <= 3 drinks/occasion for females) and that the risk for alcohol disorders and other health effects rises proportionally with the number of drinks per week and how often a drinker exceeds daily limits.  Discussed cessation/primary prevention of drug use and availability of treatment for abuse.   Diet: Encouraged to adjust caloric intake to maintain  or achieve ideal body weight, to reduce intake of dietary saturated fat and total fat, to limit sodium intake by avoiding high  sodium foods and not adding table salt, and to maintain adequate dietary potassium and calcium  preferably from fresh fruits, vegetables, and low-fat dairy products.    stressed the importance of regular exercise  Injury prevention: Discussed safety belts, safety helmets, smoke detector, smoking near bedding or upholstery.   Dental health: Discussed importance of regular tooth brushing, flossing, and dental visits.    NEXT PREVENTATIVE PHYSICAL DUE IN 1 YEAR. Return as able for shave biopsy.

## 2024-06-18 NOTE — Assessment & Plan Note (Signed)
 Discussed that she does not qualify for any weight loss medications based on her BMI and health issues. Will check labs to look for other issues. Continue diet and exercise.

## 2024-06-19 ENCOUNTER — Ambulatory Visit: Payer: Self-pay | Admitting: Family Medicine

## 2024-06-19 LAB — CBC WITH DIFFERENTIAL/PLATELET
Basophils Absolute: 0 x10E3/uL (ref 0.0–0.2)
Basos: 1 %
EOS (ABSOLUTE): 0.1 x10E3/uL (ref 0.0–0.4)
Eos: 2 %
Hematocrit: 44.5 % (ref 34.0–46.6)
Hemoglobin: 14.6 g/dL (ref 11.1–15.9)
Immature Grans (Abs): 0 x10E3/uL (ref 0.0–0.1)
Immature Granulocytes: 0 %
Lymphocytes Absolute: 2.1 x10E3/uL (ref 0.7–3.1)
Lymphs: 37 %
MCH: 29.7 pg (ref 26.6–33.0)
MCHC: 32.8 g/dL (ref 31.5–35.7)
MCV: 90 fL (ref 79–97)
Monocytes Absolute: 0.6 x10E3/uL (ref 0.1–0.9)
Monocytes: 11 %
Neutrophils Absolute: 2.8 x10E3/uL (ref 1.4–7.0)
Neutrophils: 49 %
Platelets: 277 x10E3/uL (ref 150–450)
RBC: 4.92 x10E6/uL (ref 3.77–5.28)
RDW: 12.7 % (ref 11.7–15.4)
WBC: 5.8 x10E3/uL (ref 3.4–10.8)

## 2024-06-19 LAB — COMPREHENSIVE METABOLIC PANEL WITH GFR
ALT: 21 IU/L (ref 0–32)
AST: 30 IU/L (ref 0–40)
Albumin: 4.8 g/dL (ref 3.8–4.9)
Alkaline Phosphatase: 101 IU/L (ref 49–135)
BUN/Creatinine Ratio: 19 (ref 9–23)
BUN: 20 mg/dL (ref 6–24)
Bilirubin Total: 0.3 mg/dL (ref 0.0–1.2)
CO2: 22 mmol/L (ref 20–29)
Calcium: 9.6 mg/dL (ref 8.7–10.2)
Chloride: 100 mmol/L (ref 96–106)
Creatinine, Ser: 1.04 mg/dL — ABNORMAL HIGH (ref 0.57–1.00)
Globulin, Total: 2.8 g/dL (ref 1.5–4.5)
Glucose: 75 mg/dL (ref 70–99)
Potassium: 3.9 mmol/L (ref 3.5–5.2)
Sodium: 138 mmol/L (ref 134–144)
Total Protein: 7.6 g/dL (ref 6.0–8.5)
eGFR: 63 mL/min/1.73 (ref >59)

## 2024-06-19 LAB — LIPID PANEL W/O CHOL/HDL RATIO
Cholesterol, Total: 312 mg/dL — ABNORMAL HIGH (ref 100–199)
HDL: 70 mg/dL (ref >39)
LDL Chol Calc (NIH): 222 mg/dL — ABNORMAL HIGH (ref 0–99)
Triglycerides: 113 mg/dL (ref 0–149)
VLDL Cholesterol Cal: 20 mg/dL (ref 5–40)

## 2024-06-19 LAB — TSH: TSH: 1.84 u[IU]/mL (ref 0.450–4.500)

## 2024-06-19 LAB — HEPATITIS C ANTIBODY: Hep C Virus Ab: NONREACTIVE

## 2024-06-19 LAB — VITAMIN B12: Vitamin B-12: 1291 pg/mL — ABNORMAL HIGH (ref 232–1245)

## 2024-06-19 LAB — HIV ANTIBODY (ROUTINE TESTING W REFLEX): HIV Screen 4th Generation wRfx: NONREACTIVE

## 2024-06-19 LAB — HEPATITIS B SURFACE ANTIBODY, QUANTITATIVE: Hepatitis B Surf Ab Quant: <3.5 m[IU]/mL — ABNORMAL LOW

## 2024-06-19 LAB — VITAMIN D 25 HYDROXY (VIT D DEFICIENCY, FRACTURES): Vit D, 25-Hydroxy: 68.1 ng/mL (ref 30.0–100.0)

## 2024-06-22 ENCOUNTER — Other Ambulatory Visit (HOSPITAL_COMMUNITY): Payer: Self-pay

## 2024-06-22 ENCOUNTER — Other Ambulatory Visit: Payer: Self-pay | Admitting: Internal Medicine

## 2024-06-22 ENCOUNTER — Telehealth: Payer: Self-pay | Admitting: Pharmacy Technician

## 2024-06-22 MED ORDER — NEXLIZET 180-10 MG PO TABS: 1.0000 | ORAL_TABLET | Freq: Every day | ORAL | 5 refills | Status: DC

## 2024-06-22 NOTE — Telephone Encounter (Signed)
   Ran test claim for NEXLIZET. For a 30 day supply and the co-pay is 110.70 . PA is not needed at this time.    I called [facility] and verified insurance and now they are filling the prescription.

## 2024-06-22 NOTE — Addendum Note (Signed)
 Addended by: MONA VINIE BROCKS on: 06/22/2024 10:20 AM   Modules accepted: Orders

## 2024-06-25 ENCOUNTER — Encounter: Payer: Self-pay | Admitting: Obstetrics and Gynecology

## 2024-06-25 ENCOUNTER — Ambulatory Visit: Admitting: Obstetrics and Gynecology

## 2024-06-25 VITALS — BP 127/86 | HR 70 | Ht 69.0 in | Wt 179.0 lb

## 2024-06-25 DIAGNOSIS — N950 Postmenopausal bleeding: Secondary | ICD-10-CM | POA: Diagnosis not present

## 2024-06-25 MED ORDER — NEXLIZET 180-10 MG PO TABS: 1.0000 | ORAL_TABLET | Freq: Every day | ORAL | 1 refills | Status: DC

## 2024-06-25 NOTE — Progress Notes (Addendum)
 53 y.o. GYN presents for Surgical Consult.  Pt had EMB 12/15/23.   Last PAP 05/18/2024 NILM

## 2024-06-25 NOTE — Addendum Note (Signed)
 Addended by: MEMORY DELON POUR on: 06/25/2024 10:47 AM   Modules accepted: Orders

## 2024-06-25 NOTE — Progress Notes (Signed)
 GYNECOLOGY VISIT  Patient name: Jenna Holt MRN [id number]  Date of birth: May 26, 1971 Chief Complaint:   Follow-up and postmenopausal bleeding  History:  Discussed the use of AI scribe software for clinical note transcription with the patient, who gave verbal consent to proceed.  History of Present Illness Sarajean Dessert Elihue is a 53 year old female who presents with abnormal uterine bleeding while on hormone therapy.  She experiences abnormal uterine bleeding, which she believes started after beginning hormone therapy, although the exact timing is uncertain. The bleeding varies in amount, sometimes requiring a tampon, but usually a panty liner suffices. It occurs daily, with the last episode occurring last week. The bleeding is described as dark brown, almost like a clot, but can sometimes appear redder. She reports no pain associated with the bleeding.  She has been on hormone therapy, but the hormone pills do not seem to alleviate her symptoms, as she continues to experience light night sweats that interrupt her sleep.  Her past medical workup includes an ultrasound that showed a thickened endometrial lining, and an endometrial biopsy that was negative.  Family history is significant for breast cancer on her father's side, with her uncle and cousin affected. Her mother had dense, fibrous breast tissue but no breast cancer. She has tested negative for the BRCA gene mutation.  She has insurance coverage through the [employer] due to her husband's service, and her prescription coverage is through the [employer] while her medical coverage is through [employer].     The following portions of the patient's history were reviewed and updated as appropriate: allergies, current medications, past family history, past medical history, past social history, past surgical history and problem list.   Health Maintenance:   Last pap     Component Value Date/Time   DIAGPAP   05/18/2024 1034    - Negative for Intraepithelial Lesions or Malignancy (NILM)   DIAGPAP - Benign reactive/reparative changes 05/18/2024 1034   DIAGPAP  12/27/2022 0856    - Negative for intraepithelial lesion or malignancy (NILM)   HPVHIGH Negative 05/18/2024 1034   HPVHIGH Negative 12/27/2022 0856   HPVHIGH Negative 09/07/2019 0929   ADEQPAP  05/18/2024 1034    Satisfactory for evaluation; transformation zone component PRESENT.   ADEQPAP  12/27/2022 0856    Satisfactory for evaluation; transformation zone component PRESENT.   ADEQPAP  09/07/2019 0929    Satisfactory for evaluation; transformation zone component ABSENT.    Health Maintenance  Topic Date Due   Hepatitis B Vaccine (1 of 3 - 19+ 3-dose series) Never done   Flu Shot  11/06/2024*   Pneumococcal Vaccine for age over 1 (1 of 1 - PCV) 03/21/2025*   Breast Cancer Screening  12/25/2025   Pap with HPV screening  05/18/2029   Colon Cancer Screening  06/06/2031   DTaP/Tdap/Td vaccine (3 - Td or Tdap) 03/21/2034   Hepatitis C Screening  Completed   HIV Screening  Completed   Zoster (Shingles) Vaccine  Completed   HPV Vaccine  Aged Out   Meningitis B Vaccine  Aged Out   COVID-19 Vaccine  Discontinued  *Topic was postponed. The date shown is not the original due date.      Review of Systems:  Pertinent items are noted in HPI. Comprehensive review of systems was otherwise negative.   Objective:  Physical Exam BP 127/86   Pulse 70   Ht 5' 9 (1.753 m)   Wt 179 lb (81.2 kg)  LMP 07/19/2023 (Approximate)   BMI 26.43 kg/m    Physical Exam Vitals and nursing note reviewed.  Constitutional:      Appearance: Normal appearance.  HENT:     Head: Normocephalic and atraumatic.  Pulmonary:     Effort: Pulmonary effort is normal.  Skin:    General: Skin is warm and dry.  Neurological:     General: No focal deficit present.     Mental Status: She is alert.  Psychiatric:        Mood and Affect: Mood normal.         Behavior: Behavior normal.        Thought Content: Thought content normal.        Judgment: Judgment normal.      Labs and Imaging FINAL MICROSCOPIC DIAGNOSIS:   A. ENDOMETRIUM, BIOPSY:  Inactive endometrium.  Negative for endometrial intraepithelial neoplasia (EIN) and malignancy.   Pelvic US   IMPRESSION: 1. Endometrial thickness of 16 mm with multifocal subcentimeter cysts and areas of increased focal vascularity. Findings are suspicious for endometrial hyperplasia or carcinoma. 2. Intramural ovoid hypoechogenicity located within the anterior lower uterine segment measures 0.9 cm, likely a small leiomyoma if there has been no history of prior cesarean section.    Assessment & Plan:  Assessment and Plan Assessment & Plan Postmenopausal bleeding Intermittent bleeding since starting hormone therapy. Ultrasound shows thickened endometrial lining. Differential includes endometrial polyp or precancerous/cancerous changes. Hysteroscopy recommended to evaluate and potentially remove polyps and assess for malignancy. - Schedule hysteroscopy with D&C to evaluate and potentially remove polyps. - Send tissue samples to pathology for analysis. - Consider placing an IUD during hysteroscopy to protect uterine lining from estrogen effects. - ASCVD risk 2.48; negative BRCA and otherwise likely average risk for breast cancer  Vasomotor symptoms of menopause and Hormone therapy management Current hormone therapy with pills is inadequate for symptom control and causing postmenopausal bleeding. Plan to switch to a patch for better management and reduced uterine stimulation as well as likely decreased cardiovascular risk given high cholesterol, though risk mitigated by high HDL and no additional co-morbidities. Insurance coverage for the patch needs confirmation. - Switch to hormone patch for therapy management. - Confirm insurance coverage for the hormone patch. - Do not refill hormone pills if  hysteroscopy is scheduled soon; consider temporary continuation if procedure is delayed.  Surgical request placed. Patient desires surgical management with hysteroscopy possible IUD insertion.  The risks of surgery were discussed in detail with the patient including but not limited to: bleeding which may require transfusion or reoperation; infection which may require prolonged hospitalization or re-hospitalization and antibiotic therapy; injury to bowel, bladder, ureters and major vessels or other surrounding organs which may lead to other procedures; formation of adhesions; need for additional procedures including laparotomy or subsequent procedures secondary to intraoperative injury or abnormal pathology; thromboembolic phenomenon; incisional problems and other postoperative or anesthesia complications.  Patient was told that the likelihood that her condition and symptoms will be treated effectively with this surgical management was high; the postoperative expectations were also discussed in detail. The patient also understands the alternative treatment options which were discussed in full. All questions were answered.  She was told that she will be contacted by our surgical scheduler regarding the time and date of her surgery; routine preoperative instructions will be given to her by the preoperative nursing team.     Jalyah Weinheimer, MD Minimally Invasive Gynecologic [facility], [facility]

## 2024-06-26 ENCOUNTER — Telehealth: Payer: Self-pay

## 2024-06-26 ENCOUNTER — Ambulatory Visit: Admitting: Family Medicine

## 2024-06-26 NOTE — Telephone Encounter (Signed)
 I called the patient to see if she's available for surgery with Dr. Jeralyn on 07/23/24. Patient agreed to surgery details and was provided pre-op instructions.

## 2024-07-02 MED ORDER — BEMPEDOIC ACID 180 MG PO TABS: 180.0000 mg | ORAL_TABLET | Freq: Every day | ORAL | 3 refills | Status: AC

## 2024-07-02 MED ORDER — EZETIMIBE 10 MG PO TABS: 10.0000 mg | ORAL_TABLET | Freq: Every day | ORAL | 3 refills | Status: AC

## 2024-07-02 NOTE — Addendum Note (Signed)
 Addended by: JOSHUA ANDREZ PARAS on: 07/02/2024 05:00 PM   Modules accepted: Orders

## 2024-07-20 ENCOUNTER — Other Ambulatory Visit: Payer: Self-pay

## 2024-07-20 ENCOUNTER — Encounter (HOSPITAL_COMMUNITY): Payer: Self-pay | Admitting: Obstetrics and Gynecology

## 2024-07-20 NOTE — Progress Notes (Signed)
 SDW CALL  Patient was given pre-op instructions over the phone. The opportunity was given for the patient to ask questions. No further questions asked. Patient verbalized understanding of instructions given.   PCP - Duwaine Louder Cardiologist - Dr. Vinie Maxcy - LOV - 9/25  PPM/ICD - denies Device Orders - n/a Rep Notified -  n/a  Chest x-ray - denies EKG - 09/06/23 Stress Test - denies ECHO - denies Cardiac Cath - denies  Sleep Study - denies  No DM  Last dose of GLP1 agonist-  n/a GLP1 instructions:  n/a  Blood Thinner Instructions: n/a Aspirin Instructions: n/a  ERAS Protcol - clears until 0615 PRE-SURGERY Ensure or G2- n/a  COVID TEST- n/a   Anesthesia review: no  Patient denies shortness of breath, fever, cough and chest pain over the phone call   All instructions explained to the patient, with a verbal understanding of the material. Patient agrees to go over the instructions while at home for a better understanding.

## 2024-07-22 NOTE — Anesthesia Preprocedure Evaluation (Addendum)
 Anesthesia Evaluation  Patient identified by MRN, date of birth, ID band Patient awake    Reviewed: Allergy & Precautions, NPO status , Patient's Chart, lab work & pertinent test results  History of Anesthesia Complications Negative for: history of anesthetic complications  Airway Mallampati: II  TM Distance: >3 FB Neck ROM: Full    Dental no notable dental hx.    Pulmonary neg pulmonary ROS   Pulmonary exam normal        Cardiovascular negative cardio ROS Normal cardiovascular exam     Neuro/Psych   Anxiety Depression    negative neurological ROS     GI/Hepatic negative GI ROS, Neg liver ROS,,,  Endo/Other  negative endocrine ROS    Renal/GU negative Renal ROS     Musculoskeletal negative musculoskeletal ROS (+)    Abdominal   Peds  Hematology negative hematology ROS (+)   Anesthesia Other Findings   Reproductive/Obstetrics PMB                              Anesthesia Physical Anesthesia Plan  ASA: 2  Anesthesia Plan: General   Post-op Pain Management: Tylenol  PO (pre-op)* and Toradol  IV (intra-op)*   Induction: Intravenous  PONV Risk Score and Plan: 3 and Treatment may vary due to age or medical condition, Ondansetron , Dexamethasone , Midazolam  and Scopolamine  patch - Pre-op  Airway Management Planned: LMA  Additional Equipment: None  Intra-op Plan:   Post-operative Plan: Extubation in OR  Informed Consent: I have reviewed the patients History and Physical, chart, labs and discussed the procedure including the risks, benefits and alternatives for the proposed anesthesia with the patient or authorized representative who has indicated his/her understanding and acceptance.     Dental advisory given  Plan Discussed with: CRNA  Anesthesia Plan Comments:          Anesthesia Quick Evaluation

## 2024-07-23 ENCOUNTER — Ambulatory Visit (HOSPITAL_COMMUNITY): Payer: Self-pay | Admitting: Anesthesiology

## 2024-07-23 ENCOUNTER — Encounter (HOSPITAL_COMMUNITY): Payer: Self-pay | Admitting: Obstetrics and Gynecology

## 2024-07-23 ENCOUNTER — Encounter (HOSPITAL_COMMUNITY): Admission: RE | Disposition: A | Payer: Self-pay | Source: Home / Self Care | Attending: Obstetrics and Gynecology

## 2024-07-23 ENCOUNTER — Ambulatory Visit (HOSPITAL_COMMUNITY)
Admission: RE | Admit: 2024-07-23 | Discharge: 2024-07-23 | Disposition: A | Attending: Obstetrics and Gynecology | Admitting: Obstetrics and Gynecology

## 2024-07-23 ENCOUNTER — Other Ambulatory Visit (HOSPITAL_COMMUNITY): Payer: Self-pay

## 2024-07-23 DIAGNOSIS — N950 Postmenopausal bleeding: Secondary | ICD-10-CM

## 2024-07-23 DIAGNOSIS — N840 Polyp of corpus uteri: Secondary | ICD-10-CM

## 2024-07-23 DIAGNOSIS — Z30430 Encounter for insertion of intrauterine contraceptive device: Secondary | ICD-10-CM

## 2024-07-23 DIAGNOSIS — F32A Depression, unspecified: Secondary | ICD-10-CM | POA: Diagnosis not present

## 2024-07-23 DIAGNOSIS — N938 Other specified abnormal uterine and vaginal bleeding: Secondary | ICD-10-CM

## 2024-07-23 DIAGNOSIS — F419 Anxiety disorder, unspecified: Secondary | ICD-10-CM | POA: Diagnosis not present

## 2024-07-23 HISTORY — PX: INTRAUTERINE DEVICE (IUD) INSERTION: SHX5877

## 2024-07-23 HISTORY — PX: HYSTEROSCOPY WITH D & C: SHX1775

## 2024-07-23 LAB — BASIC METABOLIC PANEL WITH GFR
Anion gap: 13 (ref 5–15)
BUN: 15 mg/dL (ref 6–20)
CO2: 20 mmol/L — ABNORMAL LOW (ref 22–32)
Calcium: 8.6 mg/dL — ABNORMAL LOW (ref 8.9–10.3)
Chloride: 107 mmol/L (ref 98–111)
Creatinine, Ser: 0.9 mg/dL (ref 0.44–1.00)
GFR, Estimated: >60 mL/min (ref >60)
Glucose, Bld: 98 mg/dL (ref 70–99)
Potassium: 4.7 mmol/L (ref 3.5–5.1)
Sodium: 140 mmol/L (ref 135–145)

## 2024-07-23 LAB — CBC
HCT: 40.3 % (ref 36.0–46.0)
Hemoglobin: 13.5 g/dL (ref 12.0–15.0)
MCH: 29.9 pg (ref 26.0–34.0)
MCHC: 33.5 g/dL (ref 30.0–36.0)
MCV: 89.4 fL (ref 80.0–100.0)
Platelets: 235 K/uL (ref 150–400)
RBC: 4.51 MIL/uL (ref 3.87–5.11)
RDW: 12.8 % (ref 11.5–15.5)
WBC: 7.5 K/uL (ref 4.0–10.5)
nRBC: 0 % (ref 0.0–0.2)

## 2024-07-23 SURGERY — DILATATION AND CURETTAGE /HYSTEROSCOPY
Anesthesia: General | Site: Uterus

## 2024-07-23 MED ORDER — KETOROLAC TROMETHAMINE 30 MG/ML IJ SOLN
INTRAMUSCULAR | Status: AC
  Filled 2024-07-23: qty 1

## 2024-07-23 MED ORDER — ACETAMINOPHEN 500 MG PO TABS
1000.0000 mg | ORAL_TABLET | ORAL | Status: AC
  Administered 2024-07-23: 08:00:00 1000 mg via ORAL

## 2024-07-23 MED ORDER — CHLORHEXIDINE GLUCONATE 0.12 % MT SOLN
15.0000 mL | OROMUCOSAL | Status: AC
  Administered 2024-07-23: 08:00:00 15 mL via OROMUCOSAL
  Filled 2024-07-23: qty 15

## 2024-07-23 MED ORDER — FENTANYL CITRATE (PF) 100 MCG/2ML IJ SOLN
INTRAMUSCULAR | Status: DC | PRN
  Administered 2024-07-23 (×2): 50 ug via INTRAVENOUS

## 2024-07-23 MED ORDER — MIDAZOLAM HCL 2 MG/2ML IJ SOLN
INTRAMUSCULAR | Status: AC
  Filled 2024-07-23: qty 2

## 2024-07-23 MED ORDER — KETOROLAC TROMETHAMINE 30 MG/ML IJ SOLN
INTRAMUSCULAR | Status: DC | PRN
  Administered 2024-07-23: 10:00:00 30 mg via INTRAVENOUS

## 2024-07-23 MED ORDER — LIDOCAINE 2% (20 MG/ML) 5 ML SYRINGE
INTRAMUSCULAR | Status: DC | PRN
  Administered 2024-07-23: 09:00:00 100 mg via INTRAVENOUS

## 2024-07-23 MED ORDER — ACETAMINOPHEN 500 MG PO TABS
1000.0000 mg | ORAL_TABLET | Freq: Four times a day (QID) | ORAL | 0 refills | Status: AC | PRN
  Filled 2024-07-23: qty 120, 15d supply, fill #0

## 2024-07-23 MED ORDER — OXYCODONE HCL 5 MG PO TABS: 5.0000 mg | ORAL_TABLET | Freq: Once | ORAL | Status: DC | PRN

## 2024-07-23 MED ORDER — ACETAMINOPHEN 500 MG PO TABS
1000.0000 mg | ORAL_TABLET | Freq: Once | ORAL | Status: DC
  Filled 2024-07-23: qty 2

## 2024-07-23 MED ORDER — DROPERIDOL 2.5 MG/ML IJ SOLN: 0.6250 mg | Freq: Once | INTRAMUSCULAR | Status: DC | PRN

## 2024-07-23 MED ORDER — BUPIVACAINE HCL (PF) 0.5 % IJ SOLN
INTRAMUSCULAR | Status: AC
  Filled 2024-07-23: qty 90

## 2024-07-23 MED ORDER — LEVONORGESTREL 20 MCG/DAY IU IUD
INTRAUTERINE_SYSTEM | INTRAUTERINE | Status: AC
  Filled 2024-07-23: qty 1

## 2024-07-23 MED ORDER — LIDOCAINE 2% (20 MG/ML) 5 ML SYRINGE
INTRAMUSCULAR | Status: AC
  Filled 2024-07-23: qty 5

## 2024-07-23 MED ORDER — PROPOFOL 10 MG/ML IV BOLUS
INTRAVENOUS | Status: AC
  Filled 2024-07-23: qty 20

## 2024-07-23 MED ORDER — PROPOFOL 10 MG/ML IV BOLUS
INTRAVENOUS | Status: DC | PRN
  Administered 2024-07-23: 09:00:00 160 mg via INTRAVENOUS

## 2024-07-23 MED ORDER — BUPIVACAINE HCL (PF) 0.5 % IJ SOLN
INTRAMUSCULAR | Status: DC | PRN
  Administered 2024-07-23: 09:00:00 20 mL

## 2024-07-23 MED ORDER — LACTATED RINGERS IV SOLN: INTRAVENOUS | Status: DC

## 2024-07-23 MED ORDER — SILVER NITRATE-POT NITRATE 75-25 % EX MISC
CUTANEOUS | Status: DC | PRN
  Administered 2024-07-23: 10:00:00 3

## 2024-07-23 MED ORDER — DEXAMETHASONE SOD PHOSPHATE PF 10 MG/ML IJ SOLN
INTRAMUSCULAR | Status: DC | PRN
  Administered 2024-07-23: 09:00:00 8 mg via INTRAVENOUS

## 2024-07-23 MED ORDER — MIDAZOLAM HCL 5 MG/5ML IJ SOLN
INTRAMUSCULAR | Status: DC | PRN
  Administered 2024-07-23: 09:00:00 2 mg via INTRAVENOUS

## 2024-07-23 MED ORDER — LEVONORGESTREL 20 MCG/DAY IU IUD
1.0000 | INTRAUTERINE_SYSTEM | INTRAUTERINE | Status: AC
  Administered 2024-07-23: 10:00:00 1 via INTRAUTERINE

## 2024-07-23 MED ORDER — FENTANYL CITRATE (PF) 100 MCG/2ML IJ SOLN
INTRAMUSCULAR | Status: AC
  Filled 2024-07-23: qty 2

## 2024-07-23 MED ORDER — SCOPOLAMINE 1 MG/3DAYS TD PT72
1.0000 | MEDICATED_PATCH | TRANSDERMAL | Status: DC
  Administered 2024-07-23: 08:00:00 1 mg via TRANSDERMAL
  Filled 2024-07-23: qty 1

## 2024-07-23 MED ORDER — ONDANSETRON HCL 4 MG/2ML IJ SOLN
INTRAMUSCULAR | Status: DC | PRN
  Administered 2024-07-23: 09:00:00 4 mg via INTRAVENOUS

## 2024-07-23 MED ORDER — FENTANYL CITRATE (PF) 100 MCG/2ML IJ SOLN: 25.0000 ug | INTRAMUSCULAR | Status: DC | PRN

## 2024-07-23 MED ORDER — SODIUM CHLORIDE 0.9 % IR SOLN
Status: DC | PRN
  Administered 2024-07-23: 09:00:00 3000 mL

## 2024-07-23 MED ORDER — LACTATED RINGERS IV SOLN: INTRAVENOUS | Status: DC | PRN

## 2024-07-23 MED ORDER — OXYCODONE HCL 5 MG/5ML PO SOLN: 5.0000 mg | Freq: Once | ORAL | Status: DC | PRN

## 2024-07-23 MED ORDER — ONDANSETRON HCL 4 MG/2ML IJ SOLN
INTRAMUSCULAR | Status: AC
  Filled 2024-07-23: qty 2

## 2024-07-23 SURGICAL SUPPLY — 16 items
CATH ROBINSON RED A/P 16FR (CATHETERS) IMPLANT
COVER MAYO STAND STRL (DRAPES) ×3 IMPLANT
DEVICE MYOSURE LITE (MISCELLANEOUS) IMPLANT
DEVICE MYOSURE REACH (MISCELLANEOUS) IMPLANT
GAUZE 4X4 16PLY ~~LOC~~+RFID DBL (SPONGE) IMPLANT
GLOVE BIO SURGEON STRL SZ7 (GLOVE) ×3 IMPLANT
GLOVE BIOGEL PI IND STRL 7.0 (GLOVE) ×3 IMPLANT
GOWN STRL REUS W/ TWL XL LVL3 (GOWN DISPOSABLE) ×3 IMPLANT
KIT PROCED FLUENT PRO FLT212S (KITS) ×3 IMPLANT
KIT TURNOVER KIT B (KITS) ×3 IMPLANT
MIRENA IUD IMPLANT
PACK VAGINAL MINOR WOMEN LF (CUSTOM PROCEDURE TRAY) ×3 IMPLANT
PAD OB MATERNITY 11 LF (PERSONAL CARE ITEMS) ×3 IMPLANT
SEAL CERVICAL OMNI LOK (ABLATOR) IMPLANT
SEAL ROD LENS SCOPE MYOSURE (ABLATOR) ×3 IMPLANT
SYSTEM TISS REMOVAL MYOSURE XL (MISCELLANEOUS) IMPLANT

## 2024-07-23 NOTE — Transfer of Care (Signed)
 Immediate Anesthesia Transfer of Care Note  Patient: Newell [facility]  Procedure(s) Performed: DILATATION AND CURETTAGE /HYSTEROSCOPY (Vagina ) INSERTION, INTRAUTERINE DEVICE (Uterus)  Patient Location: PACU  Anesthesia Type:General  Level of Consciousness: drowsy  Airway & Oxygen Therapy: Patient Spontanous Breathing and Patient connected to face mask oxygen  Post-op Assessment: Report given to RN and Post -op Vital signs reviewed and stable  Post vital signs: Reviewed and stable  Last Vitals:  Vitals Value Taken Time  BP 110/79 07/23/24 09:48  Temp    Pulse 65 07/23/24 09:51  Resp 8 07/23/24 09:51  SpO2 99 % 07/23/24 09:51  Vitals shown include unfiled device data.  Last Pain:  Vitals:   07/23/24 0738  TempSrc:   PainSc: 0-No pain      Patients Stated Pain Goal: 0 (07/23/24 0738)  Complications: No notable events documented.

## 2024-07-23 NOTE — Op Note (Signed)
 Jenna Holt PROCEDURE DATE: 07/23/2024  PREOPERATIVE DIAGNOSIS: postmenopausal bleeding  POSTOPERATIVE DIAGNOSIS: postmenopausal bleeding PROCEDURE:    hysteroscopic polypectomy, dilation and curettage, insertion of intrauterine device SURGEON: Carter Quarry, MD ASSISTANT:  n/a  INDICATIONS: 53 y.o. G1P1001 with PMB.  Risks of surgery were discussed with the patient including but not limited to: bleeding which may require transfusion; infection which may require antibiotics; injury to surrounding organs; need for additional procedures including laparotomy;  and other postoperative/anesthesia complications. Written informed consent was obtained.    FINDINGS:  Normal external genitalia, 8 wk size mobile uterus with Normal contours.  Hysteroscopically: polyp noted at external os after dilation which was then noted to fill the endometrial cavity and originate from left endometrial side wall, additional polyp in right side wall, atrophic endometrium, bilateral tubal ostia visualized   ANESTHESIA: General, paracervical block INTRAVENOUS FLUIDS:  616 ml of LR ESTIMATED BLOOD LOSS:  10 ml URINE OUTPUT: n/a SPECIMENS: endometrial polyp COMPLICATIONS:  None immediate.   FLUID DEFICIT: 200ml of normal saline  PROCEDURE: The patient was taken to the operating room and placed under general anesthesia. SCDs were in place.  Time out was performed. Patient was placed in dorsolithotomy in Gurdon stirrups. She was prepped and draped in the usual sterile fashion. A speculum was placeed in the vagina. The cervix was visualized anteriorly and grasped with a single-tooth tenaculum. Paracervical block was performed with 0.5% bupivicaine with 20 cc injected. The uterus sounded to 7 cm. Sequential dilation was performed with Fredirick dilators. The hysteroscope was inserted and the endometrial cavity and inspected. There was a large polyp from the left cornua and a smaller polyp on the contralateral  side, remaining cavity atrophic with both ostia seen. Myosure reach used to resect polyps to the base. There was bleeding noted from the base of the largest polyp, initially attempted to treat with pressure. Then inserted silver  nitrate stick x3, with visualization after each. The hysteroscope re-introduced, additional 90 seconds of pressure applied to the base with the myosure device and the bleeding ceased.  The hysteroscope was removed. Mirena  IUD inserted in usual fashion with strings cut to 80m.  All instruments were removed from the vagina. All instrument, needle and lap counts were correct x2. The patient was awakened and is recovering in stable condition.   Carter Quarry, MD Minimally Invasive Gynecologic Surgery  Obstetrics and Gynecology, [facility], [facility] 07/23/2024

## 2024-07-23 NOTE — Discharge Instructions (Addendum)
 Post-surgical Instructions, Outpatient Surgery  You may expect to feel dizzy, weak, and drowsy for as long as 24 hours after receiving the medicine that made you sleep (anesthetic). For the first 24 hours after your surgery:   Do not drive a car, ride a bicycle, participate in physical activities, or take public transportation until you are done taking narcotic pain medicines or as directed by Dr. Jeralyn.  Do not drink alcohol or take tranquilizers.  Do not take medicine that has not been prescribed by your physicians.  Do not sign important papers or make important decisions while on narcotic pain medicines.  Have a responsible person with you.   May noticed bloody, watery discharge with grey color change - to be expected following procedure  PAIN MANAGEMENT Acetaminophen  1000mg  (This is the same as 2-500mg  over the counter extra strength tylenol ). Take this every 6 hours for the first 3 days or as needed afterwards for pain  DO'S AND DON'T'S Do not take a tub bath for 2 weeks.  You may shower on the first day after your surgery Do move around as you feel able.  Stairs are fine.  You may begin to exercise again as you feel able.  Do not lift any weights for two weeks. Do not put anything in the vagina for two weeks--no tampons, intercourse, or douching.    REGULAR MEDIATIONS/VITAMINS: You may restart all of your regular medications as prescribed. You may restart all of your vitamins as you normally take them.    PLEASE CALL OR SEEK MEDICAL CARE IF: You have persistent nausea and vomiting.  You have trouble eating or drinking.  You have an oral temperature above 100.5.  You have constipation that is not helped by adjusting diet or increasing fluid intake. Pain medicines are a common cause of constipation.  You have heavy vaginal bleeding You have redness or drainage from your incision(s) or there is increasing pain or tenderness near or in the surgical site.

## 2024-07-23 NOTE — Brief Op Note (Signed)
 07/23/2024  9:41 AM  PATIENT:  Jenna Holt  53 y.o. female  PRE-OPERATIVE DIAGNOSIS:  PMB POLYP  POST-OPERATIVE DIAGNOSIS:  PMBPOLYP  PROCEDURE:  Procedures with comments: DILATATION AND CURETTAGE /HYSTEROSCOPY (N/A) - Hysteroscopy/Polyp resect/D&C INSERTION, INTRAUTERINE DEVICE  SURGEON:  Surgeons and Role:    DEWAINE Jeralyn Crutch, MD - Primary  PHYSICIAN ASSISTANT: n/a  ASSISTANTS: none   ANESTHESIA:   general and paracervical block  EBL:    10 ml  BLOOD ADMINISTERED:none  DRAINS: none   LOCAL MEDICATIONS USED:  BUPIVICAINE   SPECIMEN:  Source of Specimen:  endometrial polyp  DISPOSITION OF SPECIMEN:  PATHOLOGY  COUNTS:  YES  TOURNIQUET:  * No tourniquets in log *  DICTATION: .Note written in EPIC  PLAN OF CARE: Discharge to home after PACU  PATIENT DISPOSITION:  PACU - hemodynamically stable.   Delay start of Pharmacological VTE agent (>24hrs) due to surgical blood loss or risk of bleeding: not applicable

## 2024-07-23 NOTE — Anesthesia Postprocedure Evaluation (Signed)
 Anesthesia Post Note  Patient: Jenna Holt  Procedure(s) Performed: DILATATION AND CURETTAGE /HYSTEROSCOPY (Vagina ) INSERTION, INTRAUTERINE DEVICE (Uterus)     Patient location during evaluation: PACU Anesthesia Type: General Level of consciousness: awake and alert Pain management: pain level controlled Vital Signs Assessment: post-procedure vital signs reviewed and stable Respiratory status: spontaneous breathing, nonlabored ventilation and respiratory function stable Cardiovascular status: blood pressure returned to baseline Postop Assessment: no apparent nausea or vomiting Anesthetic complications: no   No notable events documented.  Last Vitals:  Vitals:   07/23/24 1000 07/23/24 1015  BP: 133/84 (!) 148/95  Pulse: 92 70  Resp: 13 10  Temp:    SpO2: 95% 100%    Last Pain:  Vitals:   07/23/24 1015  TempSrc:   PainSc: 0-No pain                 Vertell Row

## 2024-07-23 NOTE — H&P (Signed)
 OB/GYN Pre-Op History and Physical  Jenna Holt is a 53 y.o. G1P1001 presenting for operative hysteroscopy and IUD insertion.       Past Medical History:  Diagnosis Date   Allergy    Anemia    history of   Anxiety 2007   BRCA negative 12/2022   MyRisk neg; IBIS=11.4%/riskscore=15.1%   Depression 2007   Family history of breast cancer in female    pat uncle; his daughter is BRCA pos; pt is BRCA NEG   Fibroid 02/17/2024   High cholesterol    Thickened endometrium 02/17/2024    Past Surgical History:  Procedure Laterality Date   AUGMENTATION MAMMAPLASTY Bilateral    2013   COLONOSCOPY WITH PROPOFOL  N/A 06/05/2021   Procedure: COLONOSCOPY WITH PROPOFOL ;  Surgeon: Maryruth Ole DASEN, MD;  Location: [facility] ENDOSCOPY;  Service: Endoscopy;  Laterality: N/A;   EYE SURGERY  2000   HIP SURGERY Left 06/2019   JOINT REPLACEMENT  2020   SHOULDER ARTHROSCOPY      OB History  Gravida Para Term Preterm AB Living  1 1 1   1   SAB IAB Ectopic Multiple Live Births      1    # Outcome Date GA Lbr Len/2nd Weight Sex Type Anes PTL Lv  1 Term             Social History   Socioeconomic History   Marital status: Married    Spouse name: Not on file   Number of children: 1   Years of education: Not on file   Highest education level: Associate degree: academic program  Occupational History   Not on file  Tobacco Use   Smoking status: Never   Smokeless tobacco: Never  Vaping Use   Vaping status: Never Used  Substance and Sexual Activity   Alcohol use: Yes    Comment: occ   Drug use: No   Sexual activity: Yes    Birth control/protection: Post-menopausal  Other Topics Concern   Not on file  Social History Narrative   Not on file   Social Drivers of Health   Tobacco Use: Low Risk (07/20/2024)   Patient History    Smoking Tobacco Use: Never    Smokeless Tobacco Use: Never    Passive Exposure: Not on file  Financial Resource Strain: Low Risk (06/14/2024)    Overall Financial Resource Strain (CARDIA)    Difficulty of Paying Living Expenses: Not hard at all  Food Insecurity: No Food Insecurity (06/14/2024)   Epic    Worried About Radiation Protection Practitioner of Food in the Last Year: Never true    Ran Out of Food in the Last Year: Never true  Transportation Needs: No Transportation Needs (06/14/2024)   Epic    Lack of Transportation (Medical): No    Lack of Transportation (Non-Medical): No  Physical Activity: Insufficiently Active (06/14/2024)   Exercise Vital Sign    Days of Exercise per Week: 3 days    Minutes of Exercise per Session: 40 min  Stress: Stress Concern Present (06/14/2024)   [employer] of Occupational Health - Occupational Stress Questionnaire    Feeling of Stress: To some extent  Social Connections: Socially Isolated (06/14/2024)   Social Connection and Isolation Panel    Frequency of Communication with Friends and Family: Never    Frequency of Social Gatherings with Friends and Family: Once a week    Attends Religious Services: Never    Database Administrator or Organizations: No  Attends Banker Meetings: Not on file    Marital Status: Married  Depression [phone]): Low Risk (06/18/2024)   Depression (PHQ2-9)    PHQ-2 Score: 2  Alcohol Screen: Low Risk (06/14/2024)   Alcohol Screen    Last Alcohol Screening Score (AUDIT): 1  Housing: Low Risk (06/14/2024)   Epic    Unable to Pay for Housing in the Last Year: No    Number of Times Moved in the Last Year: 0    Homeless in the Last Year: No  Utilities: Not At Risk (03/21/2024)   Epic    Threatened with loss of utilities: No  Health Literacy: Adequate Health Literacy (03/21/2024)   B1300 Health Literacy    Frequency of need for help with medical instructions: Never    Family History  Problem Relation Age of Onset   Heart attack Mother 38 - 43   Early death Mother    Hyperlipidemia Mother    Miscarriages / Stillbirths Mother    Lung cancer Father 2 - 44        smoker   Cancer Father    Early death Father    Breast cancer Paternal Uncle 35 - 78   Breast cancer Cousin 50 - 59    Medications Prior to Admission  Medication Sig Dispense Refill Last Dose/Taking   acyclovir  ointment (ZOVIRAX ) 5 % Apply 1 application. topically every 3 (three) hours. 30 g 2    Ascorbic Acid (VITAMIN C) 1000 MG tablet Take 1,000 mg by mouth daily.      Bempedoic Acid  180 MG TABS Take 1 tablet (180 mg total) by mouth daily. 90 tablet 3    clobetasol  cream (TEMOVATE ) 0.05 % Apply 1 Application topically as directed. Bid to aa hands until smooth, then d/c, may use prn flares, avoid face, groin, axilla 60 g 1    cyclobenzaprine  (FLEXERIL ) 10 MG tablet Take 1 tablet (10 mg total) by mouth at bedtime as needed for muscle spasms. 30 tablet 3    estradiol  (ESTRACE ) 0.5 MG tablet TAKE 1 TABLET BY MOUTH EVERY DAY 90 tablet 0    ezetimibe  (ZETIA ) 10 MG tablet Take 1 tablet (10 mg total) by mouth daily. 90 tablet 3    MILK THISTLE PO Take 1,300 mg by mouth daily.      NON FORMULARY Take 1 tablet by mouth daily. Beet chews 1000      NON FORMULARY Take 1 tablet by mouth daily. GNC woman multi      NON FORMULARY Take 1 tablet by mouth daily. Probiotic-women 30 billion CFU      NON FORMULARY Take 1 Package by mouth daily. GHOST GREEN (creatine HCL/ L-glutamine) 500mg       NON FORMULARY Take 1 tablet by mouth daily. Dynamic Mushrooms      NON FORMULARY Take 1 tablet by mouth daily. Btea- Alanine 3400 mg      Omega-3 Fatty Acids (OMEGA-3 FISH OIL PO) Take by mouth.      progesterone  (PROMETRIUM ) 200 MG capsule TAKE 1 CAPS NIGHTLY, 6 NIGHTS ON, 1 NIGHT OFF 90 capsule 0    psyllium (METAMUCIL) 58.6 % packet Take 1 packet by mouth daily.      Vitamin D -Vitamin K (VITAMIN K2-VITAMIN D3 PO) Take 5,000 Int'l Units/day by mouth daily.      zinc gluconate 50 MG tablet Take 50 mg by mouth daily.       Allergies[1]  Review of Systems: Negative except for what is mentioned in HPI.  Physical Exam: BP 137/89   Pulse 72   Temp 98.3 F (36.8 C) (Oral)   Resp 17   Ht 5' 9 (1.753 m)   Wt 78.5 kg   LMP 07/19/2023   SpO2 96%   BMI 25.55 kg/m  CONSTITUTIONAL: Well-developed, well-nourished and in no acute distress.  HENT:  Normocephalic, atraumatic, External right and left ear normal. Oropharynx is clear and moist EYES: Conjunctivae and EOM are normal. Pupils are equal, round, and reactive to light. No scleral icterus.  NECK: Normal range of motion, supple, no masses SKIN: Skin is warm and dry. No rash noted. Not diaphoretic. No erythema. No pallor. NEUROLGIC: Alert and oriented to person, place, and time. Normal reflexes, muscle tone coordination. No cranial nerve deficit noted. PSYCHIATRIC: Normal mood and affect. Normal behavior. Normal judgment and thought content. RESPIRATORY: Normal effort PELVIC: Deferred   Pertinent Labs/Studies:   No results found for this or any previous visit (from the past 72 hours).     Assessment and Plan :Cheryn Lundquist Holt is a 53 y.o. G1P1001 here for operative hysteroscopy, dilation and curettage and IUD insertion.   Patient desires surgical management with PMB.  The risks of surgery were discussed in detail with the patient including but not limited to: bleeding which may require transfusion or reoperation; infection which may require prolonged hospitalization or re-hospitalization and antibiotic therapy; injury to bowel, bladder, ureters and major vessels or other surrounding organs which may lead to other procedures; formation of adhesions; need for additional procedures including laparotomy or subsequent procedures secondary to intraoperative injury or abnormal pathology; thromboembolic phenomenon; incisional problems and other postoperative or anesthesia complications.  Patient was told that the likelihood that her condition and symptoms will be treated effectively with this surgical management was high; the  postoperative expectations were also discussed in detail. The patient also understands the alternative treatment options which were discussed in full. All questions were answered.      Carrick Rijos, M.D. Minimally Invasive Gynecologic Surgery and Pelvic Pain Specialist Attending Obstetrician & Gynecologist, [facility] for [facility]     [1]  Allergies Allergen Reactions   Latex Dermatitis    If worn   Penicillins Hives   Naproxen  Hives, Itching and Rash

## 2024-07-23 NOTE — Anesthesia Procedure Notes (Signed)
 Procedure Name: LMA Insertion Date/Time: 07/23/2024 9:08 AM  Performed by: Deeann Eva BROCKS, CRNAPre-anesthesia Checklist: Patient identified, Emergency Drugs available, Suction available and Patient being monitored Patient Re-evaluated:Patient Re-evaluated prior to induction Oxygen Delivery Method: Circle System Utilized Preoxygenation: Pre-oxygenation with 100% oxygen Induction Type: IV induction Ventilation: Mask ventilation without difficulty LMA: LMA with gastric port inserted LMA Size: 4.0 Number of attempts: 1 Placement Confirmation: positive ETCO2 Tube secured with: Tape Dental Injury: Teeth and Oropharynx as per pre-operative assessment

## 2024-07-24 ENCOUNTER — Encounter (HOSPITAL_COMMUNITY): Payer: Self-pay | Admitting: Obstetrics and Gynecology

## 2024-07-24 LAB — SURGICAL PATHOLOGY

## 2024-07-25 ENCOUNTER — Ambulatory Visit: Payer: Self-pay | Admitting: Obstetrics and Gynecology

## 2024-07-27 ENCOUNTER — Encounter: Payer: Self-pay | Admitting: Family Medicine

## 2024-07-27 ENCOUNTER — Other Ambulatory Visit (HOSPITAL_COMMUNITY)
Admission: RE | Admit: 2024-07-27 | Discharge: 2024-07-27 | Disposition: A | Discharge status: Home / Self Care | Source: Ambulatory Visit | Attending: Family Medicine | Admitting: Family Medicine

## 2024-07-27 ENCOUNTER — Ambulatory Visit: Admitting: Family Medicine

## 2024-07-27 VITALS — BP 125/87 | HR 101 | Temp 98.3°F | Ht 69.0 in | Wt 177.0 lb

## 2024-07-27 DIAGNOSIS — D485 Neoplasm of uncertain behavior of skin: Secondary | ICD-10-CM | POA: Diagnosis not present

## 2024-07-27 DIAGNOSIS — J069 Acute upper respiratory infection, unspecified: Secondary | ICD-10-CM

## 2024-07-27 DIAGNOSIS — L821 Other seborrheic keratosis: Secondary | ICD-10-CM | POA: Diagnosis not present

## 2024-07-27 MED ORDER — PREDNISONE 50 MG PO TABS: 50.0000 mg | ORAL_TABLET | Freq: Every day | ORAL | 0 refills | Status: AC

## 2024-07-27 NOTE — Progress Notes (Signed)
 "  BP 125/87   Pulse (!) 101   Temp 98.3 F (36.8 C) (Oral)   Ht 5' 9 (1.753 m)   Wt 177 lb (80.3 kg)   LMP 07/19/2023   SpO2 98%   BMI 26.14 kg/m    Subjective:    Patient ID: Jenna Holt, female    DOB: July 01, 1971, 53 y.o.   MRN: [id number]  HPI: Jenna Holt is a 53 y.o. female  Chief Complaint  Patient presents with   Procedure    Bip shave, left side upper back    Sinusitis   SKIN LESION Duration: chronic but enlarging Location: L flank under bra strap Painful: no Itching: no Onset: gradual Context: bigger Associated signs and symptoms: getting irritated with the bra strap History of skin cancer: no  UPPER RESPIRATORY TRACT INFECTION Duration: about 2-3 days Worst symptom: congestion Fever: no Cough: no Shortness of breath: no Wheezing: no Chest pain: no Chest tightness: no Chest congestion: no Nasal congestion: yes Runny nose: yes Post nasal drip: yes Sneezing: no Sore throat: no Swollen glands: no Sinus pressure: yes Headache: no Face pain: no Toothache: no Ear pain: no  Ear pressure: no  Eyes red/itching:no Eye drainage/crusting: no  Vomiting: no Rash: no Fatigue: yes Sick contacts: yes Strep contacts: no  Context: stable Recurrent sinusitis: no Relief with OTC cold/cough medications: no  Treatments attempted: none   Relevant past medical, surgical, family and social history reviewed and updated as indicated. Interim medical history since our last visit reviewed. Allergies and medications reviewed and updated.  Review of Systems  Constitutional: Negative.   Respiratory: Negative.    Cardiovascular: Negative.   Musculoskeletal: Negative.   Neurological: Negative.   Psychiatric/Behavioral: Negative.      Per HPI unless specifically indicated above     Objective:    BP 125/87   Pulse (!) 101   Temp 98.3 F (36.8 C) (Oral)   Ht 5' 9 (1.753 m)   Wt 177 lb (80.3 kg)   LMP 07/19/2023   SpO2 98%    BMI 26.14 kg/m   Wt Readings from Last 3 Encounters:  07/27/24 177 lb (80.3 kg)  07/23/24 173 lb (78.5 kg)  06/25/24 179 lb (81.2 kg)    Physical Exam Vitals and nursing note reviewed.  Constitutional:      General: She is not in acute distress.    Appearance: Normal appearance. She is not ill-appearing, toxic-appearing or diaphoretic.  HENT:     Head: Normocephalic and atraumatic.     Right Ear: External ear normal.     Left Ear: External ear normal.     Nose: Nose normal.     Mouth/Throat:     Mouth: Mucous membranes are moist.     Pharynx: Oropharynx is clear.  Eyes:     General: No scleral icterus.       Right eye: No discharge.        Left eye: No discharge.     Extraocular Movements: Extraocular movements intact.     Conjunctiva/sclera: Conjunctivae normal.     Pupils: Pupils are equal, round, and reactive to light.  Cardiovascular:     Rate and Rhythm: Normal rate and regular rhythm.     Pulses: Normal pulses.     Heart sounds: Normal heart sounds. No murmur heard.    No friction rub. No gallop.  Pulmonary:     Effort: Pulmonary effort is normal. No respiratory distress.     Breath  sounds: Normal breath sounds. No stridor. No wheezing, rhonchi or rales.  Chest:     Chest wall: No tenderness.  Musculoskeletal:        General: Normal range of motion.     Cervical back: Normal range of motion and neck supple.  Skin:    General: Skin is warm and dry.     Capillary Refill: Capillary refill takes less than 2 seconds.     Coloration: Skin is not jaundiced or pale.     Findings: No bruising, erythema, lesion or rash.     Comments: 3cm hyperpigmented raised lesion with areas of hyperpigmentation within on L flank along bra line  Neurological:     General: No focal deficit present.     Mental Status: She is alert and oriented to person, place, and time. Mental status is at baseline.  Psychiatric:        Mood and Affect: Mood normal.        Behavior: Behavior normal.         Thought Content: Thought content normal.        Judgment: Judgment normal.     Results for orders placed or performed during the hospital encounter of 07/23/24  CBC   Collection Time: 07/23/24  7:23 AM  Result Value Ref Range   WBC 7.5 4.0 - 10.5 K/uL   RBC 4.51 3.87 - 5.11 MIL/uL   Hemoglobin 13.5 12.0 - 15.0 g/dL   HCT 59.6 63.9 - 53.9 %   MCV 89.4 80.0 - 100.0 fL   MCH 29.9 26.0 - 34.0 pg   MCHC 33.5 30.0 - 36.0 g/dL   RDW 87.1 88.4 - 84.4 %   Platelets 235 150 - 400 K/uL   nRBC 0.0 0.0 - 0.2 %  Basic metabolic panel with GFR   Collection Time: 07/23/24  7:23 AM  Result Value Ref Range   Sodium 140 135 - 145 mmol/L   Potassium 4.7 3.5 - 5.1 mmol/L   Chloride 107 98 - 111 mmol/L   CO2 20 (L) 22 - 32 mmol/L   Glucose, Bld 98 70 - 99 mg/dL   BUN 15 6 - 20 mg/dL   Creatinine, Ser 9.09 0.44 - 1.00 mg/dL   Calcium  8.6 (L) 8.9 - 10.3 mg/dL   GFR, Estimated >39 >39 mL/min   Anion gap 13 5 - 15  Surgical pathology   Collection Time: 07/23/24  9:37 AM  Result Value Ref Range   SURGICAL PATHOLOGY      SURGICAL PATHOLOGY CASE: [id number] PATIENT: Jenna Holt Surgical Pathology Report     Clinical History: PMB, POLYP (cf)     FINAL MICROSCOPIC DIAGNOSIS:  A. ENDOMETRIUM, POLYPECTOMY: -  Benign endometrial polyp, negative for atypia/hyperplasia.    GROSS DESCRIPTION:  Received fresh is a 3.0 x 2.2 x 0.5 cm aggregate of pink-tan to hemorrhagic soft tissue.  The specimen is entirely submitted in 3 blocks. (KW, 07/23/2024)  Final Diagnosis performed by Mark LeGolvan DO.   Electronically signed 07/24/2024 Technical component performed at [employer]. [facility], [address], [place], [place] [address].  Professional component performed at [facility], [address] [place], [place] [address].  Immunohistochemistry Technical component (if applicable) was performed at [facility]. [address], [address], [place], [place] [address].   IMMUNOHISTOCHEMISTRY DISCLAIMER (if applicable): Some of these  immunohistochemical stains may have been developed and the performance characteristics determine by [facility]. Some may not have  been cleared or approved by the U.S. Food and Drug Administration. The FDA has determined that such clearance or approval is not necessary. This test is used for clinical purposes. It should not be regarded as investigational or for research. This laboratory is certified under the Clinical Laboratory Improvement Amendments of 1988 (CLIA-88) as qualified to perform high complexity clinical laboratory testing.  The controls stained appropriately.   IHC stains are performed on formalin fixed, paraffin embedded tissue using a 3,3diaminobenzidine (DAB) chromogen and Leica Bond Autostainer System. The staining intensity of the nucleus is score manually and is reported as the percentage of tumor cell nuclei demonstrating specific nuclear staining. The specimens are fixed in 10% Neutral Formalin for at least 6 hours and up to 72hrs. Thes e tests are validated on decalcified tissue. Results should be interpreted with caution given the possibility of false negative results on decalcified specimens. Antibody Clones are as follows ER-clone 37F, PR-clone 16, Ki67- clone MM1. Some of these immunohistochemical stains may have been developed and the performance characteristics determined by [facility].       Assessment & Plan:   Problem List Items Addressed This Visit   None Visit Diagnoses       Neoplasm of uncertain behavior of skin    -  Primary   Removed today and sent for pathology as below. Call with any concerns.   Relevant Orders   Surgical pathology     Upper respiratory tract infection, unspecified type       Will treat with prednisone  burst. Call with any concerns.       Skin Procedure  Procedure: Informed consent given.   Sterile prep of the area.  Area infiltrated with lidocaine  without epinephrine.  Using a surgical blade, part of the upper dermis shaved off and sent  for pathology.  Area cauterized. Pt ed on scarring.  Diagnosis:   ICD-10-CM   1. Neoplasm of uncertain behavior of skin  D48.5 Surgical pathology   Removed today and sent for pathology as below. Call with any concerns.    2. Upper respiratory tract infection, unspecified type  J06.9    Will treat with prednisone  burst. Call with any concerns.      Lesion Location/Size: 3cm hyperpigmented raised lesion with areas of hyperpigmentation within on L flank along bra line Physician: MJ Consent:  Risks, benefits, and alternative treatments discussed and all questions were answered.  Patient elected to proceed and verbal consent obtained.  Description: Area prepped and draped using semi-sterile technique. Area locally anesthetized using 3 cc's of lidocaine  1% plain. Shave biopsy of lesion performed using a dermablade.  Adequate hemostastis achieved using Silver  Nitrate. Wound dressed after application of bacitracin ointment.  Post Procedure Instructions: Wound care instructions discussed and patient was instructed to keep area clean and dry.  Signs and symptoms of infection discussed, patient agrees to contact the office ASAP should they occur.  Dressing change recommended every other day.  Follow up plan: Return in about 11 months (around 06/27/2025) for physical.      "

## 2024-07-31 ENCOUNTER — Ambulatory Visit: Payer: Self-pay | Admitting: Family Medicine

## 2024-07-31 LAB — SURGICAL PATHOLOGY

## 2024-08-23 ENCOUNTER — Encounter (INDEPENDENT_AMBULATORY_CARE_PROVIDER_SITE_OTHER): Payer: Self-pay

## 2024-08-23 ENCOUNTER — Encounter: Payer: Self-pay | Admitting: Obstetrics and Gynecology

## 2024-08-23 ENCOUNTER — Other Ambulatory Visit: Payer: Self-pay

## 2024-08-23 ENCOUNTER — Ambulatory Visit: Payer: Self-pay | Admitting: Obstetrics and Gynecology

## 2024-08-23 VITALS — BP 121/82 | HR 76 | Wt 178.6 lb

## 2024-08-23 DIAGNOSIS — N951 Menopausal and female climacteric states: Secondary | ICD-10-CM

## 2024-08-23 DIAGNOSIS — E663 Overweight: Secondary | ICD-10-CM | POA: Diagnosis not present

## 2024-08-23 DIAGNOSIS — N950 Postmenopausal bleeding: Secondary | ICD-10-CM

## 2024-08-23 NOTE — Progress Notes (Signed)
" ° °  POSTOPERATIVE VISIT NOTE   Subjective:     Jenna Holt is a 54 y.o. G1P1001 who presents to the clinic 4 weeks status post operative hysteroscopy for postmenopausal bleeding. Eating a regular diet without difficulty. Bowel movements are normal. The patient is not having any pain. Incision: n/a Vaginal bleeding: none. Stopped about 2 weeks ago  Resumed sexual acitivity:   Was on estrogen previously, stopped prior to the procedure. Since being off of it, having hot flashes. Has concerns about weight and difficulty losing weight Works out 6 days a week . Some night sweats are worse than others, so does not feel the need to resume HRT at this time.   The following portions of the patient's history were reviewed and updated as appropriate: allergies, current medications, past family history, past medical history, past social history, past surgical history, and problem list..   Review of Systems Pertinent items are noted in HPI.    Objective:    BP 121/82   Pulse 76   Wt 178 lb 9.6 oz (81 kg)   LMP 07/19/2023   BMI 26.37 kg/m  General:  alert, cooperative, and no distress  Abdomen: soft, bowel sounds active, non-tender  Incision:   N/A  Pelvic:   Exam deferred.    Pathology Results: FINAL MICROSCOPIC DIAGNOSIS:   A. ENDOMETRIUM, POLYPECTOMY:  -  Benign endometrial polyp, negative for atypia/hyperplasia.    Assessment:   Doing well postoperatively. Operative findings again reviewed. Pathology report discussed.   Plan:    1. PMB (postmenopausal bleeding) (Primary) Resolved s/p hysteroscopy with polypectomy. Reviewed intra-op findings.   2. Overweight (BMI 25.0-29.9) Offered referral to weight management given difficulty with managing weight in the postmenopausal state - Amb Ref to Medical Weight Management  3. Menopausal and female climacteric states Will hold on resuming HRT at this time   Activity restrictions: none Follow up: as needed  Akeem Heppler, MD Obstetrician & Gynecologist, [facility] for [employer], [facility] "

## 2024-08-28 ENCOUNTER — Encounter: Payer: Self-pay | Admitting: Obstetrics and Gynecology

## 2024-10-08 ENCOUNTER — Ambulatory Visit: Payer: Self-pay | Admitting: Obstetrics and Gynecology

## 2025-01-24 ENCOUNTER — Ambulatory Visit: Admitting: Dermatology
